# Patient Record
Sex: Female | Born: 1995 | Race: White | Hispanic: No | Marital: Single | Smoking: Former smoker
Health system: Southern US, Community
[De-identification: ages and names within clinical notes are randomized; demographics above are authoritative.]

## PROBLEM LIST (undated history)

## (undated) ENCOUNTER — Inpatient Hospital Stay (HOSPITAL_COMMUNITY): Payer: Self-pay

## (undated) DIAGNOSIS — N390 Urinary tract infection, site not specified: Secondary | ICD-10-CM

## (undated) DIAGNOSIS — Z3490 Encounter for supervision of normal pregnancy, unspecified, unspecified trimester: Secondary | ICD-10-CM

## (undated) DIAGNOSIS — D649 Anemia, unspecified: Secondary | ICD-10-CM

## (undated) DIAGNOSIS — N730 Acute parametritis and pelvic cellulitis: Secondary | ICD-10-CM

## (undated) DIAGNOSIS — F32A Depression, unspecified: Secondary | ICD-10-CM

## (undated) DIAGNOSIS — K219 Gastro-esophageal reflux disease without esophagitis: Secondary | ICD-10-CM

## (undated) DIAGNOSIS — F419 Anxiety disorder, unspecified: Secondary | ICD-10-CM

## (undated) DIAGNOSIS — T7840XA Allergy, unspecified, initial encounter: Secondary | ICD-10-CM

## (undated) DIAGNOSIS — F329 Major depressive disorder, single episode, unspecified: Secondary | ICD-10-CM

## (undated) DIAGNOSIS — R519 Headache, unspecified: Secondary | ICD-10-CM

## (undated) DIAGNOSIS — F429 Obsessive-compulsive disorder, unspecified: Secondary | ICD-10-CM

## (undated) HISTORY — PX: OTHER SURGICAL HISTORY: SHX169

## (undated) HISTORY — PX: LAPAROSCOPIC CHOLECYSTECTOMY: SUR755

---

## 2014-10-30 ENCOUNTER — Ambulatory Visit (HOSPITAL_COMMUNITY)
Admission: RE | Admit: 2014-10-30 | Discharge: 2014-10-30 | Disposition: A | Discharge status: Home / Self Care | Payer: Self-pay | Attending: Psychiatry | Admitting: Psychiatry

## 2014-10-30 DIAGNOSIS — F419 Anxiety disorder, unspecified: Secondary | ICD-10-CM | POA: Insufficient documentation
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
## 2015-07-06 LAB — OB RESULTS CONSOLE HEPATITIS B SURFACE ANTIGEN: HEP B S AG: NEGATIVE

## 2015-07-06 LAB — OB RESULTS CONSOLE GC/CHLAMYDIA
Chlamydia: NEGATIVE
Gonorrhea: NEGATIVE

## 2015-07-06 LAB — OB RESULTS CONSOLE RPR: RPR: NONREACTIVE

## 2015-07-06 LAB — OB RESULTS CONSOLE HIV ANTIBODY (ROUTINE TESTING): HIV: NONREACTIVE

## 2015-07-06 LAB — OB RESULTS CONSOLE RUBELLA ANTIBODY, IGM: RUBELLA: IMMUNE

## 2015-07-06 LAB — OB RESULTS CONSOLE ABO/RH: RH TYPE: POSITIVE

## 2015-07-06 LAB — OB RESULTS CONSOLE ANTIBODY SCREEN: Antibody Screen: NEGATIVE

## 2016-01-12 LAB — OB RESULTS CONSOLE GBS: STREP GROUP B AG: NEGATIVE

## 2016-01-23 ENCOUNTER — Encounter (HOSPITAL_COMMUNITY): Payer: Self-pay

## 2016-01-23 ENCOUNTER — Inpatient Hospital Stay (HOSPITAL_COMMUNITY)
Admission: AD | Admit: 2016-01-23 | Discharge: 2016-01-23 | Disposition: A | Discharge status: Home / Self Care | Payer: Medicaid Other | Source: Ambulatory Visit | Attending: Obstetrics and Gynecology | Admitting: Obstetrics and Gynecology

## 2016-01-23 DIAGNOSIS — O26893 Other specified pregnancy related conditions, third trimester: Secondary | ICD-10-CM | POA: Diagnosis not present

## 2016-01-23 DIAGNOSIS — Z3A38 38 weeks gestation of pregnancy: Secondary | ICD-10-CM | POA: Diagnosis not present

## 2016-01-23 DIAGNOSIS — O99343 Other mental disorders complicating pregnancy, third trimester: Secondary | ICD-10-CM | POA: Insufficient documentation

## 2016-01-23 DIAGNOSIS — R079 Chest pain, unspecified: Secondary | ICD-10-CM | POA: Insufficient documentation

## 2016-01-23 DIAGNOSIS — O368130 Decreased fetal movements, third trimester, not applicable or unspecified: Secondary | ICD-10-CM | POA: Insufficient documentation

## 2016-01-23 DIAGNOSIS — F419 Anxiety disorder, unspecified: Secondary | ICD-10-CM | POA: Diagnosis not present

## 2016-01-23 DIAGNOSIS — Z3689 Encounter for other specified antenatal screening: Secondary | ICD-10-CM

## 2016-01-23 HISTORY — DX: Anxiety disorder, unspecified: F41.9

## 2016-01-23 HISTORY — DX: Obsessive-compulsive disorder, unspecified: F42.9

## 2016-01-23 HISTORY — DX: Depression, unspecified: F32.A

## 2016-01-23 HISTORY — DX: Anemia, unspecified: D64.9

## 2016-01-23 HISTORY — DX: Major depressive disorder, single episode, unspecified: F32.9

## 2016-01-23 HISTORY — DX: Gastro-esophageal reflux disease without esophagitis: K21.9

## 2016-01-23 NOTE — MAU Provider Note (Signed)
History   [id number]   Chief Complaint  Patient presents with  . Decreased Fetal Movement    HPI Wendy Park is a 20 y.o. female  G1P0 at [redacted]w[redacted]d IUP here with report of decreased fetal movement with last movement felt an hour ago.  Since arrival has been feeling baby move.  Also reports cramping rated 7/10.  Denies leaking of fluid or vaginal bleeding.  Reported feeling pain in chest earlier today.  Denies pain at this time.  No report of shortness of breath.  History of anxiety, takes vistaril nightly for this.     No LMP recorded. Patient is pregnant.  OB History  Gravida Para Term Preterm AB Living  1            SAB TAB Ectopic Multiple Live Births               # Outcome Date GA Lbr Len/2nd Weight Sex Delivery Anes PTL Lv  1 Current               No past medical history on file.  No family history on file.  Social History   Social History  . Marital status: Single    Spouse name: N/A  . Number of children: N/A  . Years of education: N/A   Social History Main Topics  . Smoking status: Not on file  . Smokeless tobacco: Not on file  . Alcohol use Not on file  . Drug use: Unknown  . Sexual activity: Not on file   Other Topics Concern  . Not on file   Social History Narrative  . No narrative on file    Allergies not on file  No current facility-administered medications on file prior to encounter.    No current outpatient prescriptions on file prior to encounter.     Review of Systems  Respiratory: Negative for chest tightness, shortness of breath and wheezing.   Cardiovascular: Negative for chest pain and palpitations.  Genitourinary: Positive for pelvic pain (cramping). Negative for dyspareunia, vaginal bleeding and vaginal discharge.     Physical Exam   Vitals:   01/23/16 2240  BP: 127/72  Pulse: 100  Resp: 20  Temp: 98.5 F (36.9 C)  TempSrc: Oral  SpO2: 100%  Weight: 188 lb (85.3 kg)  Height: 5\' 2"  (1.575 m)    Physical Exam   Constitutional: She is oriented to person, place, and time. She appears well-developed and well-nourished.  HENT:  Head: Normocephalic.  Neck: Normal range of motion. Neck supple.  Cardiovascular: Normal rate, regular rhythm and normal heart sounds.   Respiratory: Effort normal and breath sounds normal. No respiratory distress.  GI: Soft. There is no tenderness.  Genitourinary: No bleeding in the vagina. Vaginal discharge (mucusy) found.  Musculoskeletal: Normal range of motion. She exhibits no edema.  Neurological: She is alert and oriented to person, place, and time.  Skin: Skin is warm and dry.   Dilation: 1 Effacement (%): 50 Cervical Position: Posterior Presentation: Vertex Exam by:: w. KarimCNM MAU Course  Procedures  MDM No results found for this or any previous visit (from the past 24 hour(s)).  2315 Dr. Richardson Dopp given report and current patient status > discharge home with follow-up on Tuesday as scheduled.    Assessment and Plan  20 y.o. G1P0 at [redacted]w[redacted]d IUP  Reactive NST  Plan: Discharge home Keep scheduled appt Reviewed fetal kick count  Wendy Park, CNM 01/23/2016 11:19 PM

## 2016-01-23 NOTE — MAU Note (Signed)
Urine in lab 

## 2016-01-23 NOTE — MAU Note (Signed)
Pt denies any pain now nor SOB or chest pain after hearing the baby's heart beat. Marland Kitchen

## 2016-01-23 NOTE — MAU Note (Signed)
Pt c/o decreased fetal movement today-last felt movement aobut an hour ago. Some lower abdominal cramping-rates 7/10; did not take medication but slept. Denies LOF or vag bleeding. Has c/o chest pain today-denies SOB or hx of cardiac issues. Has hx of anxiety-thinks it could be related to that.

## 2016-02-01 ENCOUNTER — Inpatient Hospital Stay (HOSPITAL_COMMUNITY)
Admission: AD | Admit: 2016-02-01 | Discharge: 2016-02-02 | Disposition: A | Discharge status: Home / Self Care | Payer: Medicaid Other | Source: Ambulatory Visit | Attending: Obstetrics and Gynecology | Admitting: Obstetrics and Gynecology

## 2016-02-01 DIAGNOSIS — F32A Depression, unspecified: Secondary | ICD-10-CM | POA: Diagnosis not present

## 2016-02-01 DIAGNOSIS — F17210 Nicotine dependence, cigarettes, uncomplicated: Secondary | ICD-10-CM | POA: Insufficient documentation

## 2016-02-01 DIAGNOSIS — O99333 Smoking (tobacco) complicating pregnancy, third trimester: Secondary | ICD-10-CM | POA: Insufficient documentation

## 2016-02-01 DIAGNOSIS — F429 Obsessive-compulsive disorder, unspecified: Secondary | ICD-10-CM | POA: Diagnosis not present

## 2016-02-01 DIAGNOSIS — O99343 Other mental disorders complicating pregnancy, third trimester: Secondary | ICD-10-CM

## 2016-02-01 DIAGNOSIS — F418 Other specified anxiety disorders: Secondary | ICD-10-CM | POA: Insufficient documentation

## 2016-02-01 DIAGNOSIS — Z3A39 39 weeks gestation of pregnancy: Secondary | ICD-10-CM

## 2016-02-01 DIAGNOSIS — O26893 Other specified pregnancy related conditions, third trimester: Secondary | ICD-10-CM | POA: Insufficient documentation

## 2016-02-01 DIAGNOSIS — F17200 Nicotine dependence, unspecified, uncomplicated: Secondary | ICD-10-CM | POA: Diagnosis present

## 2016-02-01 DIAGNOSIS — O99013 Anemia complicating pregnancy, third trimester: Secondary | ICD-10-CM

## 2016-02-01 DIAGNOSIS — O99613 Diseases of the digestive system complicating pregnancy, third trimester: Secondary | ICD-10-CM | POA: Insufficient documentation

## 2016-02-01 DIAGNOSIS — K219 Gastro-esophageal reflux disease without esophagitis: Secondary | ICD-10-CM

## 2016-02-01 DIAGNOSIS — D649 Anemia, unspecified: Secondary | ICD-10-CM | POA: Insufficient documentation

## 2016-02-01 DIAGNOSIS — Z8759 Personal history of other complications of pregnancy, childbirth and the puerperium: Secondary | ICD-10-CM

## 2016-02-01 DIAGNOSIS — F329 Major depressive disorder, single episode, unspecified: Secondary | ICD-10-CM | POA: Diagnosis not present

## 2016-02-01 DIAGNOSIS — F419 Anxiety disorder, unspecified: Secondary | ICD-10-CM | POA: Diagnosis not present

## 2016-02-01 NOTE — MAU Note (Signed)
Pt here for ?LOF since 2pm. Some lower abdominal cramping. Denies vag bleeding. +FM.

## 2016-02-02 ENCOUNTER — Encounter (HOSPITAL_COMMUNITY): Payer: Self-pay | Admitting: *Deleted

## 2016-02-02 ENCOUNTER — Inpatient Hospital Stay (HOSPITAL_COMMUNITY)
Admission: AD | Admit: 2016-02-02 | Discharge: 2016-02-06 | DRG: 766 | Disposition: A | Discharge status: Home / Self Care | Payer: Medicaid Other | Source: Ambulatory Visit | Attending: Obstetrics & Gynecology | Admitting: Obstetrics & Gynecology

## 2016-02-02 ENCOUNTER — Encounter (HOSPITAL_COMMUNITY): Payer: Self-pay

## 2016-02-02 DIAGNOSIS — F419 Anxiety disorder, unspecified: Secondary | ICD-10-CM | POA: Diagnosis not present

## 2016-02-02 DIAGNOSIS — O99344 Other mental disorders complicating childbirth: Secondary | ICD-10-CM | POA: Diagnosis present

## 2016-02-02 DIAGNOSIS — F429 Obsessive-compulsive disorder, unspecified: Secondary | ICD-10-CM | POA: Diagnosis not present

## 2016-02-02 DIAGNOSIS — O9962 Diseases of the digestive system complicating childbirth: Secondary | ICD-10-CM | POA: Diagnosis present

## 2016-02-02 DIAGNOSIS — O99214 Obesity complicating childbirth: Secondary | ICD-10-CM | POA: Diagnosis present

## 2016-02-02 DIAGNOSIS — Z3A39 39 weeks gestation of pregnancy: Secondary | ICD-10-CM

## 2016-02-02 DIAGNOSIS — O339 Maternal care for disproportion, unspecified: Secondary | ICD-10-CM | POA: Diagnosis present

## 2016-02-02 DIAGNOSIS — F329 Major depressive disorder, single episode, unspecified: Secondary | ICD-10-CM | POA: Diagnosis not present

## 2016-02-02 DIAGNOSIS — D649 Anemia, unspecified: Secondary | ICD-10-CM | POA: Diagnosis present

## 2016-02-02 DIAGNOSIS — Z6834 Body mass index (BMI) 34.0-34.9, adult: Secondary | ICD-10-CM

## 2016-02-02 DIAGNOSIS — K219 Gastro-esophageal reflux disease without esophagitis: Secondary | ICD-10-CM | POA: Diagnosis present

## 2016-02-02 DIAGNOSIS — O4202 Full-term premature rupture of membranes, onset of labor within 24 hours of rupture: Principal | ICD-10-CM | POA: Diagnosis present

## 2016-02-02 DIAGNOSIS — Z8759 Personal history of other complications of pregnancy, childbirth and the puerperium: Secondary | ICD-10-CM

## 2016-02-02 DIAGNOSIS — O9902 Anemia complicating childbirth: Secondary | ICD-10-CM | POA: Diagnosis present

## 2016-02-02 DIAGNOSIS — F17210 Nicotine dependence, cigarettes, uncomplicated: Secondary | ICD-10-CM | POA: Diagnosis present

## 2016-02-02 DIAGNOSIS — F17200 Nicotine dependence, unspecified, uncomplicated: Secondary | ICD-10-CM | POA: Diagnosis present

## 2016-02-02 DIAGNOSIS — F32A Depression, unspecified: Secondary | ICD-10-CM | POA: Diagnosis not present

## 2016-02-02 DIAGNOSIS — O99334 Smoking (tobacco) complicating childbirth: Secondary | ICD-10-CM | POA: Diagnosis present

## 2016-02-02 DIAGNOSIS — O4290 Premature rupture of membranes, unspecified as to length of time between rupture and onset of labor, unspecified weeks of gestation: Secondary | ICD-10-CM | POA: Diagnosis present

## 2016-02-02 DIAGNOSIS — O326XX0 Maternal care for compound presentation, not applicable or unspecified: Secondary | ICD-10-CM | POA: Diagnosis present

## 2016-02-02 DIAGNOSIS — F418 Other specified anxiety disorders: Secondary | ICD-10-CM | POA: Diagnosis present

## 2016-02-02 HISTORY — DX: Allergy, unspecified, initial encounter: T78.40XA

## 2016-02-02 LAB — TYPE AND SCREEN
ABO/RH(D): O POS
ANTIBODY SCREEN: NEGATIVE

## 2016-02-02 LAB — CBC
HEMATOCRIT: 32.5 % — AB (ref 36.0–46.0)
HEMOGLOBIN: 11.4 g/dL — AB (ref 12.0–15.0)
MCH: 29 pg (ref 26.0–34.0)
MCHC: 35.1 g/dL (ref 30.0–36.0)
MCV: 82.7 fL (ref 78.0–100.0)
Platelets: 196 10*3/uL (ref 150–400)
RBC: 3.93 MIL/uL (ref 3.87–5.11)
RDW: 13.5 % (ref 11.5–15.5)
WBC: 9.1 10*3/uL (ref 4.0–10.5)

## 2016-02-02 LAB — POCT FERN TEST: POCT Fern Test: POSITIVE

## 2016-02-02 LAB — ABO/RH: ABO/RH(D): O POS

## 2016-02-02 LAB — AMNISURE RUPTURE OF MEMBRANE (ROM) NOT AT ARMC: Amnisure ROM: NEGATIVE

## 2016-02-02 MED ORDER — OXYCODONE-ACETAMINOPHEN 5-325 MG PO TABS: 1.0000 | ORAL_TABLET | ORAL | Status: DC | PRN

## 2016-02-02 MED ORDER — OXYTOCIN BOLUS FROM INFUSION
500.0000 mL | Freq: Once | INTRAVENOUS | Status: DC
Start: 2016-02-02 — End: 2016-02-03

## 2016-02-02 MED ORDER — OXYTOCIN 40 UNITS IN LACTATED RINGERS INFUSION - SIMPLE MED: 2.5000 [IU]/h | INTRAVENOUS | Status: DC

## 2016-02-02 MED ORDER — TERBUTALINE SULFATE 1 MG/ML IJ SOLN: 0.2500 mg | Freq: Once | INTRAMUSCULAR | Status: DC | PRN

## 2016-02-02 MED ORDER — LACTATED RINGERS IV SOLN
500.0000 mL | INTRAVENOUS | Status: DC | PRN
  Administered 2016-02-03: 1000 mL via INTRAVENOUS

## 2016-02-02 MED ORDER — ONDANSETRON HCL 4 MG/2ML IJ SOLN: 4.0000 mg | Freq: Four times a day (QID) | INTRAMUSCULAR | Status: DC | PRN

## 2016-02-02 MED ORDER — ZOLPIDEM TARTRATE 5 MG PO TABS: 5.0000 mg | ORAL_TABLET | Freq: Every evening | ORAL | Status: DC | PRN

## 2016-02-02 MED ORDER — LACTATED RINGERS IV SOLN
INTRAVENOUS | Status: DC
  Administered 2016-02-02 – 2016-02-03 (×3): via INTRAVENOUS

## 2016-02-02 MED ORDER — OXYCODONE-ACETAMINOPHEN 5-325 MG PO TABS: 2.0000 | ORAL_TABLET | ORAL | Status: DC | PRN

## 2016-02-02 MED ORDER — SOD CITRATE-CITRIC ACID 500-334 MG/5ML PO SOLN
30.0000 mL | ORAL | Status: DC | PRN
  Administered 2016-02-03: 30 mL via ORAL

## 2016-02-02 MED ORDER — ACETAMINOPHEN 325 MG PO TABS: 650.0000 mg | ORAL_TABLET | ORAL | Status: DC | PRN

## 2016-02-02 MED ORDER — LIDOCAINE HCL (PF) 1 % IJ SOLN
30.0000 mL | INTRAMUSCULAR | Status: AC | PRN
  Administered 2016-02-03: 5 mL via SUBCUTANEOUS

## 2016-02-02 MED ORDER — OXYTOCIN 40 UNITS IN LACTATED RINGERS INFUSION - SIMPLE MED
1.0000 m[IU]/min | INTRAVENOUS | Status: DC
  Administered 2016-02-02: 1 m[IU]/min via INTRAVENOUS
  Filled 2016-02-02: qty 1000

## 2016-02-02 MED ORDER — MISOPROSTOL 25 MCG QUARTER TABLET: 25.0000 ug | ORAL_TABLET | ORAL | Status: DC | PRN

## 2016-02-02 NOTE — Discharge Instructions (Signed)
Braxton Hicks Contractions °Contractions of the uterus can occur throughout pregnancy. Contractions are not always a sign that you are in labor.  °WHAT ARE BRAXTON HICKS CONTRACTIONS?  °Contractions that occur before labor are called Braxton Hicks contractions, or false labor. Toward the end of pregnancy (32-34 weeks), these contractions can develop more often and may become more forceful. This is not true labor because these contractions do not result in opening (dilatation) and thinning of the cervix. They are sometimes difficult to tell apart from true labor because these contractions can be forceful and people have different pain tolerances. You should not feel embarrassed if you go to the hospital with false labor. Sometimes, the only way to tell if you are in true labor is for your health care provider to look for changes in the cervix. °If there are no prenatal problems or other health problems associated with the pregnancy, it is completely safe to be sent home with false labor and await the onset of true labor. °HOW CAN YOU TELL THE DIFFERENCE BETWEEN TRUE AND FALSE LABOR? °False Labor °· The contractions of false labor are usually shorter and not as hard as those of true labor.   °· The contractions are usually irregular.   °· The contractions are often felt in the front of the lower abdomen and in the groin.   °· The contractions may go away when you walk around or change positions while lying down.   °· The contractions get weaker and are shorter lasting as time goes on.   °· The contractions do not usually become progressively stronger, regular, and closer together as with true labor.   °True Labor °· Contractions in true labor last 30-70 seconds, become very regular, usually become more intense, and increase in frequency.   °· The contractions do not go away with walking.   °· The discomfort is usually felt in the top of the uterus and spreads to the lower abdomen and low back.   °· True labor can be  determined by your health care provider with an exam. This will show that the cervix is dilating and getting thinner.   °WHAT TO REMEMBER °· Keep up with your usual exercises and follow other instructions given by your health care provider.   °· Take medicines as directed by your health care provider.   °· Keep your regular prenatal appointments.   °· Eat and drink lightly if you think you are going into labor.   °· If Braxton Hicks contractions are making you uncomfortable:   °¨ Change your position from lying down or resting to walking, or from walking to resting.   °¨ Sit and rest in a tub of warm water.   °¨ Drink 2-3 glasses of water. Dehydration may cause these contractions.   °¨ Do slow and deep breathing several times an hour.   °WHEN SHOULD I SEEK IMMEDIATE MEDICAL CARE? °Seek immediate medical care if: °· Your contractions become stronger, more regular, and closer together.   °· You have fluid leaking or gushing from your vagina.   °· You have a fever.   °· You pass blood-tinged mucus.   °· You have vaginal bleeding.   °· You have continuous abdominal pain.   °· You have low back pain that you never had before.   °· You feel your baby's head pushing down and causing pelvic pressure.   °· Your baby is not moving as much as it used to.   °  °This information is not intended to replace advice given to you by your health care provider. Make sure you discuss any questions you have with your health care   provider. °  °Document Released: 06/19/2005 Document Revised: 06/24/2013 Document Reviewed: 03/31/2013 °Elsevier Interactive Patient Education ©2016 Else[employer] Inc. ° °

## 2016-02-02 NOTE — MAU Note (Signed)
States water broke at 1655. Some cramping.

## 2016-02-02 NOTE — Anesthesia Pain Management Evaluation Note (Signed)
  CRNA Pain Management Visit Note  Patient: Wendy Park, 20 y.o., female  "Hello I am a member of the anesthesia team at [facility]. We have an anesthesia team available at all times to provide care throughout the hospital, including epidural management and anesthesia for C-section. I don't know your plan for the delivery whether it a natural birth, water birth, IV sedation, nitrous supplementation, doula or epidural, but we want to meet your pain goals."   1.Was your pain managed to your expectations on prior hospitalizations?   No prior hospitalizations  2.What is your expectation for pain management during this hospitalization?     Epidural  3.How can we help you reach that goal? epidural  Record the patient's initial score and the patient's pain goal.   Pain: 2  Pain Goal: 5 [facility] wants you to be able to say your pain was always managed very well.  Luman Holway 02/02/2016

## 2016-02-02 NOTE — MAU Provider Note (Signed)
History   20 yo G1P0 at 68 5/7 weeks presented after calling to report two episodes of ? Leaking today--previous episode during the day, called office, advised to continue to observe.  2nd episode tonight, with "panties wet", but dry pad on arrival.  Reports mild cramping, good FM.  Membranes swept in office yesterday, with cervix 1 cm, thick by patient report.    [place] for previous polyhydramnios noted at 37 weeks, now resolved to AFI 20, 80%ile on last 2 exams, BPP 8/8.  Pregnancy remarkable for use of Zoloft and Vistaril due to anxiety, normal anatomy US with posterior placenta, mild polyhydramnios noted at 35 weeks, AFI 20 on most recent US, GBS negative.  Patient Active Problem List   Diagnosis Date Noted  . Anemia 02/02/2016  . Anxiety 02/02/2016  . Depression 02/02/2016  . OCD (obsessive compulsive disorder) 02/02/2016  . Smoker 02/02/2016  . H/O polyhydramnios--resolved at 38 weeks 02/02/2016    Chief Complaint  Patient presents with  . Rupture of Membranes   HPI:  As above  OB History    Gravida Para Term Preterm AB Living   1             SAB TAB Ectopic Multiple Live Births                  Past Medical History:  Diagnosis Date  . Anemia   . Anxiety   . Depression   . GERD (gastroesophageal reflux disease)   . OCD (obsessive compulsive disorder)   Sees Deatra Robinson, PA, for mental health issues.  Past Surgical History:  Procedure Laterality Date  . miringotomy      Family Hx:  Sister with asthma, thyroid dx, and chronic Hep C; Mother anxiety  Social History  Substance Use Topics  . Smoking status: Current Every Day Smoker    Packs/day: 0.25    Years: 5.00    Types: Cigarettes  . Smokeless tobacco: Current User  . Alcohol use No  Patient is Caucasian, single, with FOB, Dedra Skeens, who is involved and supportive.  She has 2 years of college and is currently unemployed.  She lives in [place].  Allergies: No Known Allergies  Prescriptions Prior to  Admission  Medication Sig Dispense Refill Last Dose  . hydrOXYzine (ATARAX/VISTARIL) 50 MG tablet Take 50 mg by mouth 3 (three) times daily as needed.   01/22/2016 at Unknown time  . prenatal vitamin w/FE, FA (PRENATAL 1 + 1) 27-1 MG TABS tablet Take 1 tablet by mouth daily at 12 noon.   01/22/2016 at Unknown time  . sertraline (ZOLOFT) 100 MG tablet Take 100 mg by mouth daily.   01/22/2016 at Unknown time     Prenatal Transfer Tool  Maternal Diabetes: No Genetic Screening: Normal 1st trimester screen and AFP Maternal Ultrasounds/Referrals: Abnormal:  Findings:   Other:Polyhydramnios at 35 weeks, resolved on f/u US Fetal Ultrasounds or other Referrals:  None Maternal Substance Abuse:  Yes:  Type: Smoker Significant Maternal Medications:  Meds include: Zoloft Significant Maternal Lab Results: Lab values include: Group B Strep negative  Prenatal Labs: O+ Antibody neg RPR NR HIV NR HBsAG negative Rubella immune Glucola WNL Genetic screening WNL Hgb 12.6 at NOB, 11 at 28 weeks    ROS:  Occasional cramping, vaginal d/c, +FM Physical Exam   Blood pressure 123/79, pulse 106, temperature 97.9 F (36.6 C), temperature source Oral, resp. rate 20, height 5\' 2"  (1.575 m), weight 86.2 kg (190 lb), SpO2 99 %.  Physical Exam In NAD Chest clear Heart RRR without murmur Abd gravid, NT Pelvic--no obvious leaking, cervix loose 1 cm, 50%, vtx, -2, small amount thin d/c. Ext WNL  FHR Category 1 UCs mild irritability  ED Course  Assessment: IUP at 39 5/7 weeks ? Leaking of fluid GBS negative Anxiety/OCD  Plan: Amnisure NST   Nigel Bridgeman CNM, MSN 02/02/2016 12:14 AM   Addendum: Results for orders placed or performed during the hospital encounter of 02/01/16 (from the past 24 hour(s))  Amnisure rupture of membrane (rom)not at [facility]     Status: None   Collection Time: 02/02/16 12:03 AM  Result Value Ref Range   Amnisure ROM NEGATIVE    FHR Category 1 UCs mild  irritability  D/C home with labor precautions. Needs ROB appt next week--awaiting call from office to schedule. Reassured as to normalcy of status.  Nigel Bridgeman, CNM 02/02/16 [phone]

## 2016-02-02 NOTE — H&P (Signed)
History   20 yo G1P0 at 69 5/7 weeks presented with SROM at 4:55p today, clear fluid, mild cramping.  Cervix was 1-2, 70%, vtx, -2 in MAU.  [place] for previous polyhydramnios noted at 37 weeks, now resolved to AFI 20, 80%ile on last 2 exams, BPP 8/8.  Pregnancy remarkable for use of Zoloft and Vistaril due to anxiety, normal anatomy US with posterior placenta, mild polyhydramnios noted at 35 weeks, AFI 20 on most recent US, GBS negative.      Patient Active Problem List   Diagnosis Date Noted  . Anemia 02/02/2016  . Anxiety 02/02/2016  . Depression 02/02/2016  . OCD (obsessive compulsive disorder) 02/02/2016  . Smoker 02/02/2016  . H/O polyhydramnios--resolved at 38 weeks 02/02/2016       Chief Complaint  Patient presents with  . Rupture of Membranes   HPI:  As above          OB History    Gravida Para Term Preterm AB Living   1             SAB TAB Ectopic Multiple Live Births                      Past Medical History:  Diagnosis Date  . Anemia   . Anxiety   . Depression   . GERD (gastroesophageal reflux disease)   . OCD (obsessive compulsive disorder)   Sees Deatra Robinson, PA, for mental health issues.       Past Surgical History:  Procedure Laterality Date  . miringotomy      Family Hx:  Sister with asthma, thyroid dx, and chronic Hep C; Mother anxiety       Social History  Substance Use Topics  . Smoking status: Current Every Day Smoker    Packs/day: 0.25    Years: 5.00    Types: Cigarettes  . Smokeless tobacco: Current User  . Alcohol use No  Patient is Caucasian, single, with FOB, Dedra Skeens, who is involved and supportive.  She has 2 years of college and is currently unemployed.  She lives in [place].  Allergies: No Known Allergies         Prescriptions Prior to Admission  Medication Sig Dispense Refill Last Dose  . hydrOXYzine (ATARAX/VISTARIL) 50 MG tablet Take 50 mg by mouth 3 (three) times daily as needed.    01/22/2016 at Unknown time  . prenatal vitamin w/FE, FA (PRENATAL 1 + 1) 27-1 MG TABS tablet Take 1 tablet by mouth daily at 12 noon.   01/22/2016 at Unknown time  . sertraline (ZOLOFT) 100 MG tablet Take 100 mg by mouth daily.   01/22/2016 at Unknown time     Prenatal Transfer Tool  Maternal Diabetes: No Genetic Screening: Normal 1st trimester screen and AFP Maternal Ultrasounds/Referrals: Abnormal:  Findings:   Other:Polyhydramnios at 35 weeks, resolved on f/u US Fetal Ultrasounds or other Referrals:  None Maternal Substance Abuse:  Yes:  Type: Smoker Significant Maternal Medications:  Meds include: Zoloft Significant Maternal Lab Results: Lab values include: Group B Strep negative  Prenatal Labs: O+ Antibody neg RPR NR HIV NR HBsAG negative Rubella immune Glucola WNL Genetic screening WNL Hgb 12.6 at NOB, 11 at 28 weeks    ROS:  Occasional cramping, leaking clear fluid, +FM Physical Exam   Vitals:   02/02/16 1718 02/02/16 1830 02/02/16 1903  BP: 126/74 106/61   Pulse: (!) 126 (!) 119   Resp: 18 18 20   Temp: 98.4 F (  36.9 C) 98.2 F (36.8 C)   TempSrc: Oral Oral   Weight: 85.3 kg (188 lb)    Height:  (1.575 m)      Physical Exam In NAD Chest clear Heart RRR without murmur Abd gravid, NT Pelvic--Leaking clear fluid, VE loose 1 cm, 70%, vtx, -2 by RN exam at 5:30p Ext WNL  FHR Category 1 UCs mild irritability  ED Course  Assessment: IUP at 39 5/7 weeks SROM at 5p--clear fluid No labor GBS negative Anxiety/OCD--on Zoloft and Vistaril, in therapy  Plan: Admitted to [facility] per consult with Dr. Normand Sloop. Routine [facility] orders Pain med/epidural prn Discussed use of pitocin with patient and family, including R&B.  They are agreeable with proceeding with pitocin augmentation. Will allow light meal before beginning pitocin.   Nigel Bridgeman CNM, MSN   02/02/16 7:20p

## 2016-02-02 NOTE — Progress Notes (Signed)
  Subjective: Finished light meal  Objective: BP 108/64   Pulse (!) 101   Temp 97.8 F (36.6 C) (Oral)   Resp 18   Ht 5\' 2"  (1.575 m)   Wt 85.3 kg (188 lb)   BMI 34.39 kg/m  No intake/output data recorded. No intake/output data recorded.  FHT: Category 1 UC:   Irregular, mild SVE:   Dilation: 1.5 Effacement (%): 70 Station: -2 Exam by:: V Daylee Delahoz  Leaking clear fluid  Assessment:  PROM at term, no labor GBS positive  Plan: Start pitocin augmentation--patient agreeable with plan.  Nigel Bridgeman CNM 02/02/2016, 10:12 PM

## 2016-02-03 ENCOUNTER — Encounter (HOSPITAL_COMMUNITY): Payer: Self-pay | Admitting: *Deleted

## 2016-02-03 ENCOUNTER — Inpatient Hospital Stay (HOSPITAL_COMMUNITY): Payer: Medicaid Other | Admitting: Anesthesiology

## 2016-02-03 ENCOUNTER — Encounter (HOSPITAL_COMMUNITY)
Admission: AD | Disposition: A | Discharge status: Home / Self Care | Payer: Self-pay | Source: Ambulatory Visit | Attending: Obstetrics & Gynecology

## 2016-02-03 LAB — RPR: RPR: NONREACTIVE

## 2016-02-03 SURGERY — Surgical Case
Anesthesia: Epidural

## 2016-02-03 MED ORDER — SIMETHICONE 80 MG PO CHEW
80.0000 mg | CHEWABLE_TABLET | ORAL | Status: DC
  Administered 2016-02-04 – 2016-02-06 (×3): 80 mg via ORAL
  Filled 2016-02-03 (×3): qty 1

## 2016-02-03 MED ORDER — WITCH HAZEL-GLYCERIN EX PADS: 1.0000 "application " | MEDICATED_PAD | CUTANEOUS | Status: DC | PRN

## 2016-02-03 MED ORDER — OXYTOCIN 10 UNIT/ML IJ SOLN
INTRAMUSCULAR | Status: AC
  Filled 2016-02-03: qty 4

## 2016-02-03 MED ORDER — SCOPOLAMINE 1 MG/3DAYS TD PT72: 1.0000 | MEDICATED_PATCH | Freq: Once | TRANSDERMAL | Status: DC

## 2016-02-03 MED ORDER — IBUPROFEN 600 MG PO TABS
600.0000 mg | ORAL_TABLET | Freq: Four times a day (QID) | ORAL | Status: DC
  Administered 2016-02-04 – 2016-02-06 (×11): 600 mg via ORAL
  Filled 2016-02-03 (×11): qty 1

## 2016-02-03 MED ORDER — KETOROLAC TROMETHAMINE 30 MG/ML IJ SOLN: 30.0000 mg | Freq: Once | INTRAMUSCULAR | Status: DC

## 2016-02-03 MED ORDER — SODIUM CHLORIDE 0.9% FLUSH: 3.0000 mL | INTRAVENOUS | Status: DC | PRN

## 2016-02-03 MED ORDER — COCONUT OIL OIL
1.0000 "application " | TOPICAL_OIL | Status: DC | PRN
  Administered 2016-02-05: 1 via TOPICAL
  Filled 2016-02-03: qty 120

## 2016-02-03 MED ORDER — DIPHENHYDRAMINE HCL 25 MG PO CAPS: 25.0000 mg | ORAL_CAPSULE | ORAL | Status: DC | PRN

## 2016-02-03 MED ORDER — SCOPOLAMINE 1 MG/3DAYS TD PT72
MEDICATED_PATCH | TRANSDERMAL | Status: AC
  Filled 2016-02-03: qty 1

## 2016-02-03 MED ORDER — ONDANSETRON HCL 4 MG/2ML IJ SOLN: 4.0000 mg | Freq: Three times a day (TID) | INTRAMUSCULAR | Status: DC | PRN

## 2016-02-03 MED ORDER — DIPHENHYDRAMINE HCL 50 MG/ML IJ SOLN
12.5000 mg | INTRAMUSCULAR | Status: DC | PRN
  Administered 2016-02-03 (×2): 12.5 mg via INTRAVENOUS
  Filled 2016-02-03: qty 1

## 2016-02-03 MED ORDER — EPHEDRINE 5 MG/ML INJ: 10.0000 mg | INTRAVENOUS | Status: DC | PRN

## 2016-02-03 MED ORDER — SIMETHICONE 80 MG PO CHEW
80.0000 mg | CHEWABLE_TABLET | Freq: Three times a day (TID) | ORAL | Status: DC
  Administered 2016-02-04 – 2016-02-06 (×8): 80 mg via ORAL
  Filled 2016-02-03 (×9): qty 1

## 2016-02-03 MED ORDER — MEPERIDINE HCL 25 MG/ML IJ SOLN: 6.2500 mg | INTRAMUSCULAR | Status: DC | PRN

## 2016-02-03 MED ORDER — KETOROLAC TROMETHAMINE 30 MG/ML IJ SOLN: 30.0000 mg | Freq: Four times a day (QID) | INTRAMUSCULAR | Status: AC | PRN

## 2016-02-03 MED ORDER — PHENYLEPHRINE 40 MCG/ML (10ML) SYRINGE FOR IV PUSH (FOR BLOOD PRESSURE SUPPORT): 80.0000 ug | PREFILLED_SYRINGE | INTRAVENOUS | Status: DC | PRN

## 2016-02-03 MED ORDER — KETOROLAC TROMETHAMINE 30 MG/ML IJ SOLN
30.0000 mg | Freq: Four times a day (QID) | INTRAMUSCULAR | Status: DC | PRN
  Administered 2016-02-03: 30 mg via INTRAMUSCULAR

## 2016-02-03 MED ORDER — ONDANSETRON HCL 4 MG/2ML IJ SOLN
INTRAMUSCULAR | Status: DC | PRN
  Administered 2016-02-03: 4 mg via INTRAVENOUS

## 2016-02-03 MED ORDER — DEXAMETHASONE SODIUM PHOSPHATE 4 MG/ML IJ SOLN
INTRAMUSCULAR | Status: DC | PRN
  Administered 2016-02-03: 4 mg via INTRAVENOUS

## 2016-02-03 MED ORDER — SCOPOLAMINE 1 MG/3DAYS TD PT72
MEDICATED_PATCH | TRANSDERMAL | Status: DC | PRN
  Administered 2016-02-03: 1 via TRANSDERMAL

## 2016-02-03 MED ORDER — LACTATED RINGERS IV SOLN
INTRAVENOUS | Status: DC
  Administered 2016-02-04: 06:00:00 via INTRAVENOUS

## 2016-02-03 MED ORDER — LIDOCAINE-EPINEPHRINE (PF) 2 %-1:200000 IJ SOLN
INTRAMUSCULAR | Status: AC
  Filled 2016-02-03: qty 20

## 2016-02-03 MED ORDER — FENTANYL 2.5 MCG/ML BUPIVACAINE 1/10 % EPIDURAL INFUSION (WH - ANES)
INTRAMUSCULAR | Status: AC
  Administered 2016-02-03: 14 mL/h via EPIDURAL
  Filled 2016-02-03: qty 125

## 2016-02-03 MED ORDER — OXYTOCIN 10 UNIT/ML IJ SOLN
INTRAVENOUS | Status: DC | PRN
  Administered 2016-02-03: 40 [IU] via INTRAVENOUS

## 2016-02-03 MED ORDER — PROMETHAZINE HCL 25 MG/ML IJ SOLN: 6.2500 mg | INTRAMUSCULAR | Status: DC | PRN

## 2016-02-03 MED ORDER — MENTHOL 3 MG MT LOZG: 1.0000 | LOZENGE | OROMUCOSAL | Status: DC | PRN

## 2016-02-03 MED ORDER — CEFAZOLIN SODIUM-DEXTROSE 2-4 GM/100ML-% IV SOLN
INTRAVENOUS | Status: AC
  Filled 2016-02-03: qty 100

## 2016-02-03 MED ORDER — LACTATED RINGERS IV SOLN
INTRAVENOUS | Status: DC | PRN
  Administered 2016-02-03: 20:00:00 via INTRAVENOUS

## 2016-02-03 MED ORDER — FENTANYL CITRATE (PF) 100 MCG/2ML IJ SOLN
INTRAMUSCULAR | Status: DC | PRN
  Administered 2016-02-03: 100 ug via EPIDURAL

## 2016-02-03 MED ORDER — SODIUM BICARBONATE 8.4 % IV SOLN
INTRAVENOUS | Status: AC
  Filled 2016-02-03: qty 50

## 2016-02-03 MED ORDER — DIPHENHYDRAMINE HCL 25 MG PO CAPS: 25.0000 mg | ORAL_CAPSULE | Freq: Four times a day (QID) | ORAL | Status: DC | PRN

## 2016-02-03 MED ORDER — ONDANSETRON HCL 4 MG/2ML IJ SOLN
INTRAMUSCULAR | Status: AC
  Filled 2016-02-03: qty 2

## 2016-02-03 MED ORDER — ACETAMINOPHEN 500 MG PO TABS
1000.0000 mg | ORAL_TABLET | Freq: Four times a day (QID) | ORAL | Status: AC
  Administered 2016-02-04 (×3): 1000 mg via ORAL
  Filled 2016-02-03 (×4): qty 2

## 2016-02-03 MED ORDER — SIMETHICONE 80 MG PO CHEW: 80.0000 mg | CHEWABLE_TABLET | ORAL | Status: DC | PRN

## 2016-02-03 MED ORDER — SODIUM CHLORIDE 0.9 % IR SOLN
Status: DC | PRN
  Administered 2016-02-03 (×2): 1

## 2016-02-03 MED ORDER — LIDOCAINE-EPINEPHRINE (PF) 2 %-1:200000 IJ SOLN
INTRAMUSCULAR | Status: DC | PRN
  Administered 2016-02-03 (×2): 10 mL via EPIDURAL
  Administered 2016-02-03: 4 mL via EPIDURAL

## 2016-02-03 MED ORDER — CEFAZOLIN SODIUM-DEXTROSE 2-3 GM-% IV SOLR
INTRAVENOUS | Status: DC | PRN
  Administered 2016-02-03: 2 g via INTRAVENOUS

## 2016-02-03 MED ORDER — DIBUCAINE 1 % RE OINT: 1.0000 "application " | TOPICAL_OINTMENT | RECTAL | Status: DC | PRN

## 2016-02-03 MED ORDER — ZOLPIDEM TARTRATE 5 MG PO TABS: 5.0000 mg | ORAL_TABLET | Freq: Every evening | ORAL | Status: DC | PRN

## 2016-02-03 MED ORDER — FENTANYL CITRATE (PF) 100 MCG/2ML IJ SOLN
100.0000 ug | INTRAMUSCULAR | Status: DC | PRN
  Administered 2016-02-03 (×2): 100 ug via INTRAVENOUS
  Filled 2016-02-03 (×2): qty 2

## 2016-02-03 MED ORDER — ACETAMINOPHEN 325 MG PO TABS
650.0000 mg | ORAL_TABLET | ORAL | Status: DC | PRN
  Administered 2016-02-05 – 2016-02-06 (×4): 650 mg via ORAL
  Filled 2016-02-03 (×4): qty 2

## 2016-02-03 MED ORDER — LACTATED RINGERS IV SOLN
INTRAVENOUS | Status: DC | PRN
  Administered 2016-02-03 (×3): via INTRAVENOUS

## 2016-02-03 MED ORDER — HYDROXYZINE HCL 50 MG PO TABS
50.0000 mg | ORAL_TABLET | Freq: Three times a day (TID) | ORAL | Status: DC
  Administered 2016-02-03 – 2016-02-06 (×10): 50 mg via ORAL
  Filled 2016-02-03 (×14): qty 1

## 2016-02-03 MED ORDER — LACTATED RINGERS IV SOLN
500.0000 mL | Freq: Once | INTRAVENOUS | Status: AC
  Administered 2016-02-03: 500 mL via INTRAVENOUS

## 2016-02-03 MED ORDER — MORPHINE SULFATE (PF) 0.5 MG/ML IJ SOLN
INTRAMUSCULAR | Status: DC | PRN
  Administered 2016-02-03: 3 mg via EPIDURAL

## 2016-02-03 MED ORDER — NALOXONE HCL 2 MG/2ML IJ SOSY: 1.0000 ug/kg/h | PREFILLED_SYRINGE | INTRAVENOUS | Status: DC | PRN

## 2016-02-03 MED ORDER — FENTANYL CITRATE (PF) 100 MCG/2ML IJ SOLN
INTRAMUSCULAR | Status: AC
  Filled 2016-02-03: qty 2

## 2016-02-03 MED ORDER — TETANUS-DIPHTH-ACELL PERTUSSIS 5-2.5-18.5 LF-MCG/0.5 IM SUSP: 0.5000 mL | Freq: Once | INTRAMUSCULAR | Status: DC

## 2016-02-03 MED ORDER — IBUPROFEN 600 MG PO TABS: 600.0000 mg | ORAL_TABLET | Freq: Four times a day (QID) | ORAL | Status: DC | PRN

## 2016-02-03 MED ORDER — KETOROLAC TROMETHAMINE 30 MG/ML IJ SOLN
INTRAMUSCULAR | Status: AC
  Filled 2016-02-03: qty 1

## 2016-02-03 MED ORDER — SENNOSIDES-DOCUSATE SODIUM 8.6-50 MG PO TABS
2.0000 | ORAL_TABLET | ORAL | Status: DC
  Administered 2016-02-04 – 2016-02-06 (×3): 2 via ORAL
  Filled 2016-02-03 (×3): qty 2

## 2016-02-03 MED ORDER — OXYTOCIN 40 UNITS IN LACTATED RINGERS INFUSION - SIMPLE MED: 2.5000 [IU]/h | INTRAVENOUS | Status: AC

## 2016-02-03 MED ORDER — NALOXONE HCL 0.4 MG/ML IJ SOLN: 0.4000 mg | INTRAMUSCULAR | Status: DC | PRN

## 2016-02-03 MED ORDER — PHENYLEPHRINE 40 MCG/ML (10ML) SYRINGE FOR IV PUSH (FOR BLOOD PRESSURE SUPPORT)
PREFILLED_SYRINGE | INTRAVENOUS | Status: AC
  Filled 2016-02-03: qty 20

## 2016-02-03 MED ORDER — PRENATAL MULTIVITAMIN CH
1.0000 | ORAL_TABLET | Freq: Every day | ORAL | Status: DC
  Administered 2016-02-04 – 2016-02-06 (×3): 1 via ORAL
  Filled 2016-02-03 (×3): qty 1

## 2016-02-03 MED ORDER — FENTANYL 2.5 MCG/ML BUPIVACAINE 1/10 % EPIDURAL INFUSION (WH - ANES)
14.0000 mL/h | INTRAMUSCULAR | Status: DC | PRN
  Administered 2016-02-03 (×3): 14 mL/h via EPIDURAL
  Filled 2016-02-03 (×2): qty 125

## 2016-02-03 MED ORDER — MORPHINE SULFATE (PF) 0.5 MG/ML IJ SOLN
INTRAMUSCULAR | Status: AC
  Filled 2016-02-03: qty 10

## 2016-02-03 MED ORDER — SERTRALINE HCL 100 MG PO TABS
100.0000 mg | ORAL_TABLET | Freq: Every day | ORAL | Status: DC
  Administered 2016-02-03 – 2016-02-06 (×4): 100 mg via ORAL
  Filled 2016-02-03 (×6): qty 1

## 2016-02-03 MED ORDER — DIPHENHYDRAMINE HCL 50 MG/ML IJ SOLN
12.5000 mg | INTRAMUSCULAR | Status: DC | PRN
  Administered 2016-02-03 – 2016-02-04 (×2): 12.5 mg via INTRAVENOUS
  Filled 2016-02-03 (×2): qty 1

## 2016-02-03 MED ORDER — NICOTINE 14 MG/24HR TD PT24
14.0000 mg | MEDICATED_PATCH | TRANSDERMAL | Status: DC
  Administered 2016-02-03 – 2016-02-04 (×2): 14 mg via TRANSDERMAL
  Filled 2016-02-03 (×5): qty 1

## 2016-02-03 SURGICAL SUPPLY — 36 items
CHLORAPREP W/TINT 26ML (MISCELLANEOUS) ×2 IMPLANT
CLAMP CORD UMBIL (MISCELLANEOUS) IMPLANT
CLOTH BEACON ORANGE TIMEOUT ST (SAFETY) ×2 IMPLANT
DRAPE C SECTION CLR SCREEN (DRAPES) ×2 IMPLANT
DRSG OPSITE POSTOP 4X10 (GAUZE/BANDAGES/DRESSINGS) ×2 IMPLANT
ELECT REM PT RETURN 9FT ADLT (ELECTROSURGICAL) ×2
ELECTRODE REM PT RTRN 9FT ADLT (ELECTROSURGICAL) ×1 IMPLANT
EXTRACTOR VACUUM M CUP 4 TUBE (SUCTIONS) IMPLANT
GLOVE BIOGEL PI IND STRL 7.0 (GLOVE) ×2 IMPLANT
GLOVE BIOGEL PI INDICATOR 7.0 (GLOVE) ×2
GLOVE SURG SS PI 6.5 STRL IVOR (GLOVE) ×2 IMPLANT
GOWN STRL REUS W/TWL LRG LVL3 (GOWN DISPOSABLE) ×4 IMPLANT
KIT ABG SYR 3ML LUER SLIP (SYRINGE) IMPLANT
LIQUID BAND (GAUZE/BANDAGES/DRESSINGS) ×1 IMPLANT
NDL HYPO 25X5/8 SAFETYGLIDE (NEEDLE) IMPLANT
NEEDLE HYPO 25X5/8 SAFETYGLIDE (NEEDLE) IMPLANT
NS IRRIG 1000ML POUR BTL (IV SOLUTION) ×2 IMPLANT
PACK C SECTION WH (CUSTOM PROCEDURE TRAY) ×2 IMPLANT
PAD ABD 7.5X8 STRL (GAUZE/BANDAGES/DRESSINGS) ×1 IMPLANT
PAD OB MATERNITY 4.3X12.25 (PERSONAL CARE ITEMS) ×2 IMPLANT
PENCIL SMOKE EVAC W/HOLSTER (ELECTROSURGICAL) ×2 IMPLANT
RTRCTR C-SECT PINK 25CM LRG (MISCELLANEOUS) ×2 IMPLANT
SPONGE DRAIN TRACH 4X4 STRL 2S (GAUZE/BANDAGES/DRESSINGS) ×2 IMPLANT
SUT CHROMIC 1 CTX 36 (SUTURE) IMPLANT
SUT CHROMIC 2 0 CT 1 (SUTURE) ×2 IMPLANT
SUT MON AB 4-0 PS1 27 (SUTURE) ×2 IMPLANT
SUT PLAIN 1 NONE 54 (SUTURE) IMPLANT
SUT PLAIN 2 0 (SUTURE)
SUT PLAIN 2 0 XLH (SUTURE) ×1 IMPLANT
SUT PLAIN ABS 2-0 CT1 27XMFL (SUTURE) IMPLANT
SUT VIC AB 0 CTX 36 (SUTURE) ×4
SUT VIC AB 0 CTX36XBRD ANBCTRL (SUTURE) ×1 IMPLANT
SUT VIC AB 1 CTX 36 (SUTURE) ×4
SUT VIC AB 1 CTX36XBRD ANBCTRL (SUTURE) ×2 IMPLANT
TOWEL OR 17X24 6PK STRL BLUE (TOWEL DISPOSABLE) ×2 IMPLANT
TRAY FOLEY CATH SILVER 14FR (SET/KITS/TRAYS/PACK) ×2 IMPLANT

## 2016-02-03 NOTE — Anesthesia Preprocedure Evaluation (Addendum)
Anesthesia Evaluation  Patient identified by MRN, date of birth, ID band Patient awake    Reviewed: Allergy & Precautions, NPO status , Patient's Chart, lab work & pertinent test results  Airway Mallampati: I  TM Distance: >3 FB Neck ROM: Full    Dental  (+) Teeth Intact, Dental Advisory Given   Pulmonary Current Smoker,    breath sounds clear to auscultation       Cardiovascular  Rhythm:Regular Rate:Normal     Neuro/Psych    GI/Hepatic GERD  Medicated and Controlled,  Endo/Other  Morbid obesity  Renal/GU      Musculoskeletal   Abdominal   Peds  Hematology   Anesthesia Other Findings Pt itches with Fentanyl  Reproductive/Obstetrics                             Anesthesia Physical Anesthesia Plan  ASA: II  Anesthesia Plan: Epidural   Post-op Pain Management:    Induction:   Airway Management Planned:   Additional Equipment:   Intra-op Plan:   Post-operative Plan:   Informed Consent: I have reviewed the patients History and Physical, chart, labs and discussed the procedure including the risks, benefits and alternatives for the proposed anesthesia with the patient or authorized representative who has indicated his/her understanding and acceptance.   Dental advisory given  Plan Discussed with: CRNA and Surgeon  Anesthesia Plan Comments: (For C/S with epidural in place)       Anesthesia Quick Evaluation                                   Anesthesia Evaluation  Patient identified by MRN, date of birth, ID band Patient awake    Reviewed: Allergy & Precautions, NPO status , Patient's Chart, lab work & pertinent test results  Airway Mallampati: I  TM Distance: >3 FB Neck ROM: Full    Dental  (+) Teeth Intact, Dental Advisory Given   Pulmonary Current Smoker,    breath sounds clear to auscultation       Cardiovascular  Rhythm:Regular Rate:Normal      Neuro/Psych    GI/Hepatic GERD  Medicated and Controlled,  Endo/Other  Morbid obesity  Renal/GU      Musculoskeletal   Abdominal   Peds  Hematology   Anesthesia Other Findings Pt itches with Fentanyl  Reproductive/Obstetrics                             Anesthesia Physical Anesthesia Plan  ASA: II  Anesthesia Plan: Epidural   Post-op Pain Management:    Induction:   Airway Management Planned:   Additional Equipment:   Intra-op Plan:   Post-operative Plan:   Informed Consent: I have reviewed the patients History and Physical, chart, labs and discussed the procedure including the risks, benefits and alternatives for the proposed anesthesia with the patient or authorized representative who has indicated his/her understanding and acceptance.   Dental advisory given  Plan Discussed with: Anesthesiologist  Anesthesia Plan Comments:         Anesthesia Quick Evaluation

## 2016-02-03 NOTE — Anesthesia Postprocedure Evaluation (Signed)
Anesthesia Post Note  Patient: Wendy Park  Procedure(s) Performed: Procedure(s) (LRB): CESAREAN SECTION (N/A)  Patient location during evaluation: PACU Anesthesia Type: Epidural Level of consciousness: awake Pain management: pain level controlled Vital Signs Assessment: post-procedure vital signs reviewed and stable Respiratory status: spontaneous breathing Cardiovascular status: stable Postop Assessment: no headache, no backache, epidural receding and no signs of nausea or vomiting Anesthetic complications: no     Last Vitals:  Vitals:   02/03/16 1830 02/03/16 1903  BP: 118/69 102/87  Pulse: 92 (!) 121  Resp: 20 20  Temp:  36.8 C    Last Pain:  Vitals:   02/03/16 1903  TempSrc: Oral  PainSc:    Pain Goal: Patients Stated Pain Goal: 4 (02/03/16 0912)               Jaquel Glassburn JR,JOHN Susann Givens

## 2016-02-03 NOTE — Anesthesia Procedure Notes (Deleted)
Anesthesia Regional Block: Narrative:       

## 2016-02-03 NOTE — Progress Notes (Signed)
  Subjective: More uncomfortable with UCs--has received 2 doses IV Fentanyl with some benefit.  Desires epidural.  Objective: BP 108/75   Pulse 71   Temp 97.4 F (36.3 C) (Oral) Comment: drinking fluid  Resp 16   Ht 5\' 2"  (1.575 m)   Wt 85.3 kg (188 lb)   SpO2 98%   BMI 34.39 kg/m  No intake/output data recorded. No intake/output data recorded.  FHT: Category 1 UC:   regular, every 4 minutes SVE:   Dilation: 2.5 Effacement (%): 80 Station: -2 Exam by:: V Kam Kushnir Pitocin at 19 mu/min  Assessment:  PROM at term x 12 hours Early labor GBS negative Anxiety/OCD--on Zoloft and Vistaril  Plan: Proceed with epidural.  Wendy Park CNM 02/03/2016, 6:41 AM

## 2016-02-03 NOTE — Op Note (Signed)
Patient: Dmani, Franceschini DOB: [birth year] MRN:  [id number]  DATE OF SURGERY: 02/03/2016  PREOP DIAGNOSIS:  1. 39week 6 day EGA intrauterine pregnancy 2. Failure to progress 3.  Suspect cephalopelvic disproportion.    POSTOP DIAGNOSIS: Same as above.  PROCEDURE: Primary low uterine segment transverse cesarean section via Pfannenstiel incision.     SURGEON: Dr.  Hoover Browns  ASSISTANT: CNM Sherre Scarlet  ANESTHESIA: Epidural  COMPLICATIONS: None  FINDINGS: Viable female infant with compound presentation of hand and head, loose nuchal cord, DOA position, weight 7 pounds 9.5 ounces, Apgar scores of 10 and 10. Normal uterus and fallopian tubes and ovaries bilaterally.    EBL:  800 cc  IV FLUID:  3000 cc LR   URINE OUTPUT: 200 cc clear urine  INDICATIONS: 20 y/o P0 who presented with premature rupture of membranes. She received pitocin for labor augmentation got to active phase of labor and had minimal change over 6 hrs where she progressed from 8cm to 9cm only.  I was also concerned for cephalo pelvic disproportion therefore offered cesarean delivery.  She was consented for the procedure after explaining risks benefits and alternatives of the procedure including but not limited to heavy bleeding, infection, damage to organs.    PROCEDURE:   She was taken to the operating room where her epidural anesthesia was found to be adequate. She was prepped and draped in the usual sterile fashion and a Foley catheter was placed. She received 2 g of IV Ancef preoperatively. A Pfannenstiel incision was made with the scalpel and the incision extended through the subcutaneous layer and also the fascia with the bovie. Small perforators in the subcutaneous layer were contained with the Bovie. The fascia was nicked in the midline and then was further separated from the rectus muscles bilaterally using Mayo scissors. Kochers were placed inferiorly and then superiorly to allow further separation of fascia  from the rectus muscles.  The peritoneal cavity was entered bluntly with the fingers. The Alexis retractor was placed in. The bladder flap was created using Metzenbaum scissors.   The uterus was incised with a scalpel and the incision extended bluntly bilaterally with fingers. Clear amniotic fluid was noted.  Baby's hand was encountered upon uterine entry.  This was pushed back in then head ws flexed and delivered thorough the incision, compound presentation noted with the hand and head at delivery.  Loose nuchal  Cord was reduced then the rest of the body was delivered atraumatically.  She delivered a viable female infant, apgar scores 10, 10.  The cord was clamped and cut. Cord blood was collected.    The uterus was not exteriorized.  The placenta was delivered with gentle traction on the umbilical cord. The edges of the uterus was grasped with clamps.  The uterus was cleared of clots and debris with a lap.  The incision uterine incision was closed with #1 Vicryl in a running locked stitch while ensuring incorporation of the right edge of incision which extended laterally but not to the blood vessels.  An imbricating layer of same stitch was placed over the initial closure.  A 3 cm stable hematoma was noted above left side of incision.   Irrigation was applied and suctioned out. Excellent hemostasis was noted over the incision.  Adnexa was explored and noted as above.    The muscles were then reapproximated using chromic suture in interrupted stitches.  Fascia was closed using 0 Vicryl in a running stitch. The subcutaneous layer was irrigated and suctioned  out. Small perforators were contained with the bovie.  The subcutaneous was closed over using 1-0 plain in interrupted stitches. The skin was closed using 4-0 Monocryl. Dermabond was applied. Honeycomb and pressure dressing were then applied. The patient was then cleaned and she was taken to the recovery room in stable condition. The neonate was also taken  to the nursery in stable condition.   SPECIMEN: Umbilical cord blood  DISPOSITION: TO PACU, STABLE.    Hoover Browns, MD.

## 2016-02-03 NOTE — Interval H&P Note (Signed)
History and Physical Interval Note:  02/03/2016 7:03 PM  Wendy Park  has presented today for surgery, with the diagnosis of Failure to progress, cephalopelvic pelvic disproportion.  The various methods of treatment have been discussed with the patient and family. After consideration of risks, benefits and other options for treatment, the patient has consented to  Procedure(s): CESAREAN SECTION (N/A) as a surgical intervention .  The patient's history has been reviewed, patient examined, no change in status, stable for surgery.  I have reviewed the patient's chart and labs.  Questions were answered to the patient's satisfaction.     [facility]

## 2016-02-03 NOTE — Anesthesia Procedure Notes (Deleted)
Epidural

## 2016-02-03 NOTE — Progress Notes (Signed)
  Subjective: Aware of some cramping--3/10 on pain scale.  Visiting with family/friends.  Had requested nicotine patch.  Considering pain med.    Usually takes Zoloft and Vistaril at 3 am.  Objective: BP 115/76   Pulse 86   Temp 97.6 F (36.4 C) (Oral)   Resp 16   Ht 5\' 2"  (1.575 m)   Wt 85.3 kg (188 lb)   BMI 34.39 kg/m  No intake/output data recorded. No intake/output data recorded.  FHT: Category 1 UC:   q 3-4 min, mild/mod SVE:   Dilation: 1.5 Effacement (%): 70 Station: -2 Exam by:: V Udell Mazzocco at 10:12p Pitocin at 13 mu/min.  Assessment:  PROM at term x 8 1/2 hours Smoker Anxiety/OCD  Plan: Continue pitocin augmentation. Reviewed pain med options--recommended IV med/nitrous in early labor, epidural as labor progresses. Zoloft 100 mg po and Vistaril 50 mg now.  Nigel Bridgeman CNM 02/03/2016, 2:28 AM

## 2016-02-03 NOTE — Progress Notes (Signed)
Wendy Park is a 20 y.o. G1P0000 at [redacted]w[redacted]d admitted for rupture of membranes  Subjective: Patient comfortable with epidural.    Objective: BP (!) 105/57   Pulse 80   Temp 97.8 F (36.6 C) (Oral)   Resp 20   Ht 5\' 2"  (1.575 m)   Wt 85.3 kg (188 lb)   SpO2 97%   BMI 34.39 kg/m  No intake/output data recorded. Total I/O In: -  Out: 150 [Urine:150]  FHT:  FHR: 120 bpm, variability: moderate,  accelerations:  Present,  decelerations:  Absent UC:   regular, every 2- 4 minutes minutes SVE:   Dilation: 4 Effacement (%): 90 Station: -1, -2 Exam by:: Wendy Slipper, RN  Pitocin 25 milliunits/minute.   Labs: Lab Results  Component Value Date   WBC 9.1 02/02/2016   HGB 11.4 (L) 02/02/2016   HCT 32.5 (L) 02/02/2016   MCV 82.7 02/02/2016   PLT 196 02/02/2016   . hydrOXYzine  50 mg Oral TID  . nicotine  14 mg Transdermal Q24H  . oxytocin 40 units in LR 1000 mL  500 mL Intravenous Once  . sertraline  100 mg Oral Daily    Assessment / Plan: Augmentation of labor, progressing well  Labor: Progressing on pitocin after spontaneous rupture of membranes, ruptured 02/02/16 at 1700.  Fetal Wellbeing:  Category I Pain Control:  Epidural Anticipated MOD:  NSVD  Wendy Park WAKURU 02/03/2016, 11:36 AM

## 2016-02-03 NOTE — Transfer of Care (Signed)
Immediate Anesthesia Transfer of Care Note  Patient: Wendy Park  Procedure(s) Performed: Procedure(s): CESAREAN SECTION (N/A)  Patient Location: PACU  Anesthesia Type:Epidural  Level of Consciousness: awake  Airway & Oxygen Therapy: Patient Spontanous Breathing  Post-op Assessment: Report given to RN and Post -op Vital signs reviewed and stable  Post vital signs: stable  Last Vitals:  Vitals:   02/03/16 1830 02/03/16 1903  BP: 118/69 102/87  Pulse: 92 (!) 121  Resp: 20 20  Temp:  36.8 C    Last Pain:  Vitals:   02/03/16 1903  TempSrc: Oral  PainSc:       Patients Stated Pain Goal: 4 (02/03/16 0912)  Complications: No apparent anesthesia complications

## 2016-02-03 NOTE — Progress Notes (Signed)
Carlin Madero is a 20 y.o. G1P0000 at [redacted]w[redacted]d admitted for rupture of membranes  Subjective: Patient feeling strong contractions.   Objective: BP 118/69   Pulse 92   Temp 98.3 F (36.8 C) (Oral)   Resp 20   Ht 5\' 2"  (1.575 m)   Wt 85.3 kg (188 lb)   SpO2 97%   BMI 34.39 kg/m  No intake/output data recorded. Total I/O In: -  Out: 150 [Urine:150]  FHT:  Cat 1 UC:   regular, every 1- 3  minutes SVE:   Dilation: 9 Effacement (%): 100 Station: 0 Exam by:: DR. Sallye Ober  Labs: Lab Results  Component Value Date   WBC 9.1 02/02/2016   HGB 11.4 (L) 02/02/2016   HCT 32.5 (L) 02/02/2016   MCV 82.7 02/02/2016   PLT 196 02/02/2016    Assessment / Plan: Inadequate labor progress, was 8cm at 1pm today.  I suspect cephalopelvic disproportion.  I discussed with patient further management options and will proceed with cesarean delivery.  We discussed risks, benefits and alternatives of cesarean section including risks of bleeding, infection, damage to organs.  All her questions were answered and she was consented for the procedure.    [facility] 02/03/2016, 6:58 PM

## 2016-02-03 NOTE — H&P (View-Only) (Signed)
Wendy Park is a 20 y.o. G1P0000 at [redacted]w[redacted]d admitted for rupture of membranes  Subjective: Patient feeling strong contractions.   Objective: BP 118/69   Pulse 92   Temp 98.3 F (36.8 C) (Oral)   Resp 20   Ht 5' 2" (1.575 m)   Wt 85.3 kg (188 lb)   SpO2 97%   BMI 34.39 kg/m  No intake/output data recorded. Total I/O In: -  Out: 150 [Urine:150]  FHT:  Cat 1 UC:   regular, every 1- 3  minutes SVE:   Dilation: 9 Effacement (%): 100 Station: 0 Exam by:: DR. Akiyah Eppolito  Labs: Lab Results  Component Value Date   WBC 9.1 02/02/2016   HGB 11.4 (L) 02/02/2016   HCT 32.5 (L) 02/02/2016   MCV 82.7 02/02/2016   PLT 196 02/02/2016    Assessment / Plan: Inadequate labor progress, was 8cm at 1pm today.  I suspect cephalopelvic disproportion.  I discussed with patient further management options and will proceed with cesarean delivery.  We discussed risks, benefits and alternatives of cesarean section including risks of bleeding, infection, damage to organs.  All her questions were answered and she was consented for the procedure.    Merwin Breden WAKURU 02/03/2016, 6:58 PM  

## 2016-02-03 NOTE — Brief Op Note (Signed)
02/03/2016  8:50 PM  PATIENT:  Wendy Park  20 y.o. female  PRE-OPERATIVE DIAGNOSIS:  Suspect cephalopelvic disproportion; Failure to Progress  POST-OPERATIVE DIAGNOSIS:  Suspect cephalopelvic disproportion; Failure to Progress  PROCEDURE:  Procedure(s): CESAREAN SECTION (N/A) Primary Lower Uterine Segment Tranverse.   SURGEON:  Surgeon(s) and Role:    * Hoover Browns, MD - Primary   ASSISTANTS: Sherre Scarlet, CNM  ANESTHESIA:   epidural  EBL:  Total I/O In: 3000 [I.V.:3000] Out: 1000 [Urine:200; Blood:800]  BLOOD ADMINISTERED:none  DRAINS: none   LOCAL MEDICATIONS USED:  NONE  SPECIMEN:  Source of Specimen:  Cord blood  DISPOSITION OF SPECIMEN:  PATHOLOGY  COUNTS:  YES  TOURNIQUET:  * No tourniquets in log *  DICTATION: .Note written in EPIC  PLAN OF CARE: Admit to inpatient   PATIENT DISPOSITION:  PACU - hemodynamically stable.   Delay start of Pharmacological VTE agent (>24hrs) due to surgical blood loss or risk of bleeding: not applicable

## 2016-02-03 NOTE — Anesthesia Procedure Notes (Addendum)
Epidural Patient location during procedure: OB Start time: 02/03/2016 6:40 AM  Staffing Anesthesiologist: CREWS, DAVID  Preanesthetic Checklist Completed: patient identified, site marked, surgical consent, pre-op evaluation, timeout performed, IV checked, risks and benefits discussed and monitors and equipment checked  Epidural Patient position: sitting Prep: site prepped and draped and DuraPrep Patient monitoring: continuous pulse ox and blood pressure Approach: midline Location: L3-L4 Injection technique: LOR air  Needle:  Needle type: Tuohy  Needle gauge: 17 G Needle length: 9 cm and 9 Needle insertion depth: 6 cm Catheter type: closed end flexible Catheter size: 19 Gauge Catheter at skin depth: 10 cm Test dose: negative  Assessment Events: blood not aspirated, injection not painful, no injection resistance, negative IV test and no paresthesia  Additional Notes Catheter was in 10cm leading to a one -sided block. The catheter was pulled back to 11cm from 16cm. The patient now has a less pronounced one sided block with the right side level adequate for Csection.

## 2016-02-04 ENCOUNTER — Encounter (HOSPITAL_COMMUNITY): Payer: Self-pay | Admitting: Obstetrics & Gynecology

## 2016-02-04 LAB — CBC
HEMATOCRIT: 24.7 % — AB (ref 36.0–46.0)
HEMOGLOBIN: 8.7 g/dL — AB (ref 12.0–15.0)
MCH: 29 pg (ref 26.0–34.0)
MCHC: 35.2 g/dL (ref 30.0–36.0)
MCV: 82.3 fL (ref 78.0–100.0)
Platelets: 177 10*3/uL (ref 150–400)
RBC: 3 MIL/uL — ABNORMAL LOW (ref 3.87–5.11)
RDW: 13.4 % (ref 11.5–15.5)
WBC: 16.3 10*3/uL — ABNORMAL HIGH (ref 4.0–10.5)

## 2016-02-04 MED ORDER — OXYCODONE HCL 5 MG PO TABS
5.0000 mg | ORAL_TABLET | ORAL | Status: DC | PRN
  Administered 2016-02-04 – 2016-02-06 (×10): 5 mg via ORAL
  Filled 2016-02-04 (×10): qty 1

## 2016-02-04 NOTE — Lactation Note (Signed)
This note was copied from a baby's chart. Lactation Consultation Note: Mother was given [facility] Brochure. Basic teaching done using Baby and Me book. Advised mother to hand express colostrum. Observed several drops of colostrum. Infant has had multiple attempts to latch with feeding only 3-5 mins.  Assist mother with latching infant on in cross-cradle hold. Infant latched on without difficulty. Observed infant with good burst of rhythmic suckling. Advised mother to feed infant 8-12 times in 24 hours as well as with feeding cue. Mother informed of available Lactation services. Suggested that parent do frequent skin to skin.  Patient Name: Wendy Park [id number] Date: 02/04/2016 Reason for consult: Initial assessment   Maternal Data Has patient been taught Hand Expression?: Yes Does the patient have breastfeeding experience prior to this delivery?: No  Feeding Feeding Type: Breast Fed Length of feed: 0 min  LATCH Score/Interventions Latch: Grasps breast easily, tongue down, lips flanged, rhythmical sucking. Intervention(s): Adjust position;Assist with latch;Breast compression  Audible Swallowing: A few with stimulation Intervention(s): Skin to skin;Hand expression  Type of Nipple: Everted at rest and after stimulation  Comfort (Breast/Nipple): Soft / non-tender     Hold (Positioning): Assistance needed to correctly position infant at breast and maintain latch. Intervention(s): Breastfeeding basics reviewed;Support Pillows;Position options;Skin to skin  LATCH Score: 8  Lactation Tools Discussed/Used     Consult Status Consult Status: Follow-up Date: 02/04/16 Follow-up type: In-patient    Stevan Born [facility] 02/04/2016, 3:25 PM

## 2016-02-04 NOTE — Progress Notes (Signed)
Assisted pt up to bathroom, peri care taught, pads changed, pt tolerated ambulation well. Instructed to use incentive spirometer q hour while awake. Pt verbalized understanding.

## 2016-02-04 NOTE — Anesthesia Postprocedure Evaluation (Signed)
Anesthesia Post Note  Patient: Wendy Park  Procedure(s) Performed: Procedure(s) (LRB): CESAREAN SECTION (N/A)  Patient location during evaluation: Mother Baby Anesthesia Type: Epidural Level of consciousness: awake and alert Pain management: pain level controlled Vital Signs Assessment: post-procedure vital signs reviewed and stable Respiratory status: spontaneous breathing, nonlabored ventilation and respiratory function stable Cardiovascular status: stable Postop Assessment: no headache, no backache and epidural receding Anesthetic complications: no     Last Vitals:  Vitals:   02/04/16 0100 02/04/16 0500  BP: (!) 103/54 (!) 106/47  Pulse: 84 63  Resp: 18 20  Temp: 36.7 C 36.8 C    Last Pain:  Vitals:   02/04/16 0500  TempSrc: Oral  PainSc:    Pain Goal: Patients Stated Pain Goal: 4 (02/03/16 0912)               Junious Silk

## 2016-02-04 NOTE — Addendum Note (Signed)
Addendum  created 02/04/16 0749 by Junious Silk, CRNA   Charge Capture section accepted, Sign clinical note

## 2016-02-04 NOTE — Progress Notes (Signed)
MOB was referred for history of depression/anxiety. * Referral screened out by Clinical Social Worker because none of the following criteria appear to apply: ~ History of anxiety/depression during this pregnancy, or of post-partum depression. ~ Diagnosis of anxiety and/or depression within last 3 years OR * MOB's symptoms currently being treated with medication and/or therapy.  Per CSW chart review it is noted that MOB is currently seeing Karen Jones for behavioral health counseling and has a current prescription for Zoloft.   Please contact the Clinical Social Worker if needs arise, or if MOB requests.  Falecia Vannatter Boyd-Gilyard, MSW, LCSW Clinical Social Work [phone] 

## 2016-02-04 NOTE — Progress Notes (Signed)
UR chart review completed.  

## 2016-02-04 NOTE — Progress Notes (Signed)
Subjective:  Postpartum Day 1: Cesarean Delivery Patient reports incisional pain, tolerating PO and no problems voiding.    Objective: Vital signs in last 24 hours: Temp:  [98 F (36.7 C)-99.5 F (37.5 C)] 98.1 F (36.7 C) (08/04 1330) Pulse Rate:  [58-121] 62 (08/04 1330) Resp:  [0-32] 20 (08/04 1330) BP: (96-128)/(47-87) 98/52 (08/04 1330) SpO2:  [95 %-100 %] 96 % (08/04 0100)  Physical Exam:  General: alert and no distress Lochia: appropriate Uterine Fundus: firm Incision: Dressing has a small bloodstained. Stable. Incision intact DVT Evaluation: No evidence of DVT seen on physical exam.   Recent Labs  02/02/16 1750 02/04/16 0502  HGB 11.4* 8.7*  HCT 32.5* 24.7*    Assessment/Plan: Status post Cesarean section. Doing well postoperatively.  Anemia from blood loss at surgery. Continue current care.  Janine Limbo 02/04/2016, 5:26 PM

## 2016-02-05 LAB — BIRTH TISSUE RECOVERY COLLECTION (PLACENTA DONATION)

## 2016-02-06 MED ORDER — IBUPROFEN 600 MG PO TABS: 600.0000 mg | ORAL_TABLET | Freq: Four times a day (QID) | ORAL | 2 refills | Status: DC | PRN

## 2016-02-06 MED ORDER — FERROUS SULFATE 325 (65 FE) MG PO TABS: 325.0000 mg | ORAL_TABLET | Freq: Two times a day (BID) | ORAL | 3 refills | Status: DC

## 2016-02-06 MED ORDER — OXYCODONE HCL 5 MG PO TABS: 5.0000 mg | ORAL_TABLET | ORAL | 0 refills | Status: DC | PRN

## 2016-02-06 MED ORDER — NORETHINDRONE 0.35 MG PO TABS: 1.0000 | ORAL_TABLET | Freq: Every day | ORAL | 11 refills | Status: DC

## 2016-02-06 NOTE — Lactation Note (Signed)
This note was copied from a baby's chart. Lactation Consultation Note: Mom reports baby just fed for 15 min about 1 hour ago. Is trying to wake baby for other breast. Encouraged mom to unwrap and undress  Baby. Mom easily able to hand express Colostrum. Baby latched well and nursed for 15 min then off to sleep. Encouragement given. Mom concerned about weight loss- has given a couple of bottles of formula through the night.  Encouraged to always breast feed first- to try both breasts then offer EBM or formula. Reports she has pumped once yesterday with DEBP. States she is planning on getting pump from Medicaid through her OB office. Does not have WIC. Discussed options about pump for home. Parents will think about it and let me know, No further questions at present. Reviewed our phone number to call with questions. OP appointments and BFSG as resources after DC. To call prn   Patient Name: Girl Morton AmyKaitlyn Michelini YNWGN'FToday's Date: 02/06/2016 Reason for consult: Follow-up assessment   Maternal Data Formula Feeding for Exclusion: No Has patient been taught Hand Expression?: Yes Does the patient have breastfeeding experience prior to this delivery?: No  Feeding Feeding Type: Breast Fed Length of feed: 15 min  LATCH Score/Interventions Latch: Grasps breast easily, tongue down, lips flanged, rhythmical sucking.  Audible Swallowing: A few with stimulation Intervention(s): Hand expression  Type of Nipple: Everted at rest and after stimulation  Comfort (Breast/Nipple): Filling, red/small blisters or bruises, mild/mod discomfort  Problem noted: Mild/Moderate discomfort Interventions (Mild/moderate discomfort): Comfort gels;Hand expression  Hold (Positioning): Assistance needed to correctly position infant at breast and maintain latch. Intervention(s): Breastfeeding basics reviewed  LATCH Score: 7  Lactation Tools Discussed/Used WIC Program: No Pump Review: Setup, frequency, and  cleaning   Consult Status Consult Status: Complete    Pamelia HoitWeeks, Izumi Mixon D 02/06/2016, 8:40 AM

## 2016-02-06 NOTE — Progress Notes (Signed)
Patient reports non-productive, infrequent cough. Lungs ausculated: clear at bases bilaterally. Mild expiratory wheezing noted over upper lobes. Few fine rales ausculated over left upper lobe. Upon instruction, patient used inspirometer, inspiring to level of 1750 consistently. No coughing prompted. Patient encouraged to use the inspirometer every hour today while awake and to use Albuterol inhaler as needed and directed for chest tightness/wheezing. Patient states that she is interested in starting the Nicotine Patch again.

## 2016-02-06 NOTE — Discharge Instructions (Signed)
Contraception Choices Contraception (birth control) is the use of any methods or devices to prevent pregnancy. Below are some methods to help avoid pregnancy. HORMONAL METHODS   Contraceptive implant. This is a thin, plastic tube containing progesterone hormone. It does not contain estrogen hormone. Your health care provider inserts the tube in the inner part of the upper arm. The tube can remain in place for up to 3 years. After 3 years, the implant must be removed. The implant prevents the ovaries from releasing an egg (ovulation), thickens the cervical mucus to prevent sperm from entering the uterus, and thins the lining of the inside of the uterus.  Progesterone-only injections. These injections are given every 3 months by your health care provider to prevent pregnancy. This synthetic progesterone hormone stops the ovaries from releasing eggs. It also thickens cervical mucus and changes the uterine lining. This makes it harder for sperm to survive in the uterus.  Birth control pills. These pills contain estrogen and progesterone hormone. They work by preventing the ovaries from releasing eggs (ovulation). They also cause the cervical mucus to thicken, preventing the sperm from entering the uterus. Birth control pills are prescribed by a health care provider.Birth control pills can also be used to treat heavy periods.  Minipill. This type of birth control pill contains only the progesterone hormone. They are taken every day of each month and must be prescribed by your health care provider.  Birth control patch. The patch contains hormones similar to those in birth control pills. It must be changed once a week and is prescribed by a health care provider.  Vaginal ring. The ring contains hormones similar to those in birth control pills. It is left in the vagina for 3 weeks, removed for 1 week, and then a new one is put back in place. The patient must be comfortable inserting and removing the ring  from the vagina.A health care provider's prescription is necessary.  Emergency contraception. Emergency contraceptives prevent pregnancy after unprotected sexual intercourse. This pill can be taken right after sex or up to 5 days after unprotected sex. It is most effective the sooner you take the pills after having sexual intercourse. Most emergency contraceptive pills are available without a prescription. Check with your pharmacist. Do not use emergency contraception as your only form of birth control. BARRIER METHODS   Female condom. This is a thin sheath (latex or rubber) that is worn over the penis during sexual intercourse. It can be used with spermicide to increase effectiveness.  Female condom. This is a soft, loose-fitting sheath that is put into the vagina before sexual intercourse.  Diaphragm. This is a soft, latex, dome-shaped barrier that must be fitted by a health care provider. It is inserted into the vagina, along with a spermicidal jelly. It is inserted before intercourse. The diaphragm should be left in the vagina for 6 to 8 hours after intercourse.  Cervical cap. This is a round, soft, latex or plastic cup that fits over the cervix and must be fitted by a health care provider. The cap can be left in place for up to 48 hours after intercourse.  Sponge. This is a soft, circular piece of polyurethane foam. The sponge has spermicide in it. It is inserted into the vagina after wetting it and before sexual intercourse.  Spermicides. These are chemicals that kill or block sperm from entering the cervix and uterus. They come in the form of creams, jellies, suppositories, foam, or tablets. They do not require a  prescription. They are inserted into the vagina with an applicator before having sexual intercourse. The process must be repeated every time you have sexual intercourse. INTRAUTERINE CONTRACEPTION  Intrauterine device (IUD). This is a T-shaped device that is put in a woman's uterus  during a menstrual period to prevent pregnancy. There are 2 types:  Copper IUD. This type of IUD is wrapped in copper wire and is placed inside the uterus. Copper makes the uterus and fallopian tubes produce a fluid that kills sperm. It can stay in place for 10 years.  Hormone IUD. This type of IUD contains the hormone progestin (synthetic progesterone). The hormone thickens the cervical mucus and prevents sperm from entering the uterus, and it also thins the uterine lining to prevent implantation of a fertilized egg. The hormone can weaken or kill the sperm that get into the uterus. It can stay in place for 3-5 years, depending on which type of IUD is used. PERMANENT METHODS OF CONTRACEPTION  Female tubal ligation. This is when the woman's fallopian tubes are surgically sealed, tied, or blocked to prevent the egg from traveling to the uterus.  Hysteroscopic sterilization. This involves placing a small coil or insert into each fallopian tube. Your doctor uses a technique called hysteroscopy to do the procedure. The device causes scar tissue to form. This results in permanent blockage of the fallopian tubes, so the sperm cannot fertilize the egg. It takes about 3 months after the procedure for the tubes to become blocked. You must use another form of birth control for these 3 months.  Female sterilization. This is when the female has the tubes that carry sperm tied off (vasectomy).This blocks sperm from entering the vagina during sexual intercourse. After the procedure, the man can still ejaculate fluid (semen). NATURAL PLANNING METHODS  Natural family planning. This is not having sexual intercourse or using a barrier method (condom, diaphragm, cervical cap) on days the woman could become pregnant.  Calendar method. This is keeping track of the length of each menstrual cycle and identifying when you are fertile.  Ovulation method. This is avoiding sexual intercourse during ovulation.  Symptothermal  method. This is avoiding sexual intercourse during ovulation, using a thermometer and ovulation symptoms.  Post-ovulation method. This is timing sexual intercourse after you have ovulated. Regardless of which type or method of contraception you choose, it is important that you use condoms to protect against the transmission of sexually transmitted infections (STIs). Talk with your health care provider about which form of contraception is most appropriate for you.   This information is not intended to replace advice given to you by your health care provider. Make sure you discuss any questions you have with your health care provider.   Document Released: 06/19/2005 Document Revised: 06/24/2013 Document Reviewed: 12/12/2012 Elsevier Interactive Patient Education 2016 [employer]. Anemia, Nonspecific Anemia is a condition in which the concentration of red blood cells or hemoglobin in the blood is below normal. Hemoglobin is a substance in red blood cells that carries oxygen to the tissues of the body. Anemia results in not enough oxygen reaching these tissues.  CAUSES  Common causes of anemia include:   Excessive bleeding. Bleeding may be internal or external. This includes excessive bleeding from periods (in women) or from the intestine.   Poor nutrition.   Chronic kidney, thyroid, and liver disease.  Bone marrow disorders that decrease red blood cell production.  Cancer and treatments for cancer.  HIV, AIDS, and their treatments.  Spleen problems that  increase red blood cell destruction.  Blood disorders.  Excess destruction of red blood cells due to infection, medicines, and autoimmune disorders. SIGNS AND SYMPTOMS   Minor weakness.   Dizziness.   Headache.  Palpitations.   Shortness of breath, especially with exercise.   Paleness.  Cold sensitivity.  Indigestion.  Nausea.  Difficulty sleeping.  Difficulty concentrating. Symptoms may occur suddenly or  they may develop slowly.  DIAGNOSIS  Additional blood tests are often needed. These help your health care provider determine the best treatment. Your health care provider will check your stool for blood and look for other causes of blood loss.  TREATMENT  Treatment varies depending on the cause of the anemia. Treatment can include:   Supplements of iron, vitamin K53, or folic acid.   Hormone medicines.   A blood transfusion. This may be needed if blood loss is severe.   Hospitalization. This may be needed if there is significant continual blood loss.   Dietary changes.  Spleen removal. HOME CARE INSTRUCTIONS Keep all follow-up appointments. It often takes many weeks to correct anemia, and having your health care provider check on your condition and your response to treatment is very important. SEEK IMMEDIATE MEDICAL CARE IF:   You develop extreme weakness, shortness of breath, or chest pain.   You become dizzy or have trouble concentrating.  You develop heavy vaginal bleeding.   You develop a rash.   You have bloody or black, tarry stools.   You faint.   You vomit up blood.   You vomit repeatedly.   You have abdominal pain.  You have a fever or persistent symptoms for more than 2-3 days.   You have a fever and your symptoms suddenly get worse.   You are dehydrated.  MAKE SURE YOU:  Understand these instructions.  Will watch your condition.  Will get help right away if you are not doing well or get worse.   This information is not intended to replace advice given to you by your health care provider. Make sure you discuss any questions you have with your health care provider.   Document Released: 07/27/2004 Document Revised: 02/19/2013 Document Reviewed: 12/13/2012 Elsevier Interactive Patient Education 2016 [employer]. Iron-Rich Diet Iron is a mineral that helps your body to produce hemoglobin. Hemoglobin is a protein in your red blood cells  that carries oxygen to your body's tissues. Eating too little iron may cause you to feel weak and tired, and it can increase your risk for infection. Eating enough iron is necessary for your body's metabolism, muscle function, and nervous system. Iron is naturally found in many foods. It can also be added to foods or fortified in foods. There are two types of dietary iron:  Heme iron. Heme iron is absorbed by the body more easily than nonheme iron. Heme iron is found in meat, poultry, and fish.  Nonheme iron. Nonheme iron is found in dietary supplements, iron-fortified grains, beans, and vegetables. You may need to follow an iron-rich diet if:  You have been diagnosed with iron deficiency or iron-deficiency anemia.  You have a condition that prevents you from absorbing dietary iron, such as:  Infection in your intestines.  Celiac disease. This involves long-lasting (chronic) inflammation of your intestines.  You do not eat enough iron.  You eat a diet that is high in foods that impair iron absorption.  You have lost a lot of blood.  You have heavy bleeding during your menstrual cycle.  You are pregnant.  WHAT IS MY PLAN? Your health care provider may help you to determine how much iron you need per day based on your condition. Generally, when a person consumes sufficient amounts of iron in the diet, the following iron needs are met:  Men.  79-57 years old: 11 mg per day.  50-41 years old: 8 mg per day.  Women.   71-68 years old: 15 mg per day.  65-42 years old: 18 mg per day.  Over 10 years old: 8 mg per day.  Pregnant women: 27 mg per day.  Breastfeeding women: 9 mg per day. WHAT DO I NEED TO KNOW ABOUT AN IRON-RICH DIET?  Eat fresh fruits and vegetables that are high in vitamin C along with foods that are high in iron. This will help increase the amount of iron that your body absorbs from food, especially with foods containing nonheme iron. Foods that are high in  vitamin C include oranges, peppers, tomatoes, and mango.  Take iron supplements only as directed by your health care provider. Overdose of iron can be life-threatening. If you were prescribed iron supplements, take them with orange juice or a vitamin C supplement.  Cook foods in pots and pans that are made from iron.   Eat nonheme iron-containing foods alongside foods that are high in heme iron. This helps to improve your iron absorption.   Certain foods and drinks contain compounds that impair iron absorption. Avoid eating these foods in the same meal as iron-rich foods or with iron supplements. These include:  Coffee, black tea, and red wine.  Milk, dairy products, and foods that are high in calcium.  Beans, soybeans, and peas.  Whole grains.  When eating foods that contain both nonheme iron and compounds that impair iron absorption, follow these tips to absorb iron better.   Soak beans overnight before cooking.  Soak whole grains overnight and drain them before using.  Ferment flours before baking, such as using yeast in bread dough. WHAT FOODS CAN I EAT? Grains Iron-fortified breakfast cereal. Iron-fortified whole-wheat bread. Enriched rice. Sprouted grains. Vegetables Spinach. Potatoes with skin. Green peas. Broccoli. Red and green bell peppers. Fermented vegetables. Fruits Prunes. Raisins. Oranges. Strawberries. Mango. Grapefruit. Meats and Other Protein Sources Beef liver. Oysters. Beef. Shrimp. [place]. Chicken. [place]. Sardines. Chickpeas. Nuts. Tofu. Beverages Tomato juice. Fresh orange juice. Prune juice. Hibiscus tea. Fortified instant breakfast shakes. Condiments Tahini. Fermented soy sauce. Sweets and Desserts Black-strap molasses.  Other Wheat germ. The items listed above may not be a complete list of recommended foods or beverages. Contact your dietitian for more options. WHAT FOODS ARE NOT RECOMMENDED? Grains Whole grains. Bran cereal. Bran  flour. Oats. Vegetables Artichokes. Brussels sprouts. Kale. Fruits Blueberries. Raspberries. Strawberries. Figs. Meats and Other Protein Sources Soybeans. Products made from soy protein. Dairy Milk. Cream. Cheese. Yogurt. Cottage cheese. Beverages Coffee. Black tea. Red wine. Sweets and Desserts Cocoa. Chocolate. Ice cream. Other Basil. Oregano. Parsley. The items listed above may not be a complete list of foods and beverages to avoid. Contact your dietitian for more information.   This information is not intended to replace advice given to you by your health care provider. Make sure you discuss any questions you have with your health care provider.   Document Released: 01/31/2005 Document Revised: 07/10/2014 Document Reviewed: 01/14/2014 Elsevier Interactive Patient Education 2016 [employer] Breastfeeding and Mastitis Mastitis is inflammation of the breast tissue. It can occur in women who are breastfeeding. This can make breastfeeding painful. Mastitis will sometimes go  away on its own. Your health care provider will help determine if treatment is needed. CAUSES Mastitis is often associated with a blocked milk (lactiferous) duct. This can happen when too much milk builds up in the breast. Causes of excess milk in the breast can include:  Poor latch-on. If your baby is not latched onto the breast properly, she or he may not empty your breast completely while breastfeeding.  Allowing too much time to pass between feedings.  Wearing a bra or other clothing that is too tight. This puts extra pressure on the lactiferous ducts so milk does not flow through them as it should. Mastitis can also be caused by a bacterial infection. Bacteria may enter the breast tissue through cuts or openings in the skin. In women who are breastfeeding, this may occur because of cracked or irritated skin. Cracks in the skin are often caused when your baby does not latch on properly to the  breast. SIGNS AND SYMPTOMS  Swelling, redness, tenderness, and pain in an area of the breast.  Swelling of the glands under the arm on the same side.  Fever may or may not accompany mastitis. If an infection is allowed to progress, a collection of pus (abscess) may develop. DIAGNOSIS  Your health care provider can usually diagnose mastitis based on your symptoms and a physical exam. Tests may be done to help confirm the diagnosis. These may include:  Removal of pus from the breast by applying pressure to the area. This pus can be examined in the lab to determine which bacteria are present. If an abscess has developed, the fluid in the abscess can be removed with a needle. This can also be used to confirm the diagnosis and determine the bacteria present. In most cases, pus will not be present.  Blood tests to determine if your body is fighting a bacterial infection.  Mammogram or ultrasound tests to rule out other problems or diseases. TREATMENT  Mastitis that occurs with breastfeeding will sometimes go away on its own. Your health care provider may choose to wait 24 hours after first seeing you to decide whether a prescription medicine is needed. If your symptoms are worse after 24 hours, your health care provider will likely prescribe an antibiotic medicine to treat the mastitis. He or she will determine which bacteria are most likely causing the infection and will then select an appropriate antibiotic medicine. This is sometimes changed based on the results of tests performed to identify the bacteria, or if there is no response to the antibiotic medicine selected. Antibiotic medicines are usually given by mouth. You may also be given medicine for pain. HOME CARE INSTRUCTIONS  Only take over-the-counter or prescription medicines for pain, fever, or discomfort as directed by your health care provider.  If your health care provider prescribed an antibiotic medicine, take the medicine as  directed. Make sure you finish it even if you start to feel better.  Do not wear a tight or underwire bra. Wear a soft, supportive bra.  Increase your fluid intake, especially if you have a fever.  Continue to empty the breast. Your health care provider can tell you whether this milk is safe for your infant or needs to be thrown out. You may be told to stop nursing until your health care provider thinks it is safe for your baby. Use a breast pump if you are advised to stop nursing.  Keep your nipples clean and dry.  Empty the first breast completely before going  to the other breast. If your baby is not emptying your breasts completely for some reason, use a breast pump to empty your breasts.  If you go back to work, pump your breasts while at work to stay in time with your nursing schedule.  Avoid allowing your breasts to become overly filled with milk (engorged). SEEK MEDICAL CARE IF:  You have pus-like discharge from the breast.  Your symptoms do not improve with the treatment prescribed by your health care provider within 2 days. SEEK IMMEDIATE MEDICAL CARE IF:  Your pain and swelling are getting worse.  You have pain that is not controlled with medicine.  You have a red line extending from the breast toward your armpit.  You have a fever or persistent symptoms for more than 2-3 days.  You have a fever and your symptoms suddenly get worse. MAKE SURE YOU:   Understand these instructions.  Will watch your condition.  Will get help right away if you are not doing well or get worse.   This information is not intended to replace advice given to you by your health care provider. Make sure you discuss any questions you have with your health care provider.   Document Released: 10/14/2004 Document Revised: 06/24/2013 Document Reviewed: 01/23/2013 Elsevier Interactive Patient Education [employer]. Breastfeeding Deciding to breastfeed is one of the best choices you can  make for you and your baby. A change in hormones during pregnancy causes your breast tissue to grow and increases the number and size of your milk ducts. These hormones also allow proteins, sugars, and fats from your blood supply to make breast milk in your milk-producing glands. Hormones prevent breast milk from being released before your baby is born as well as prompt milk flow after birth. Once breastfeeding has begun, thoughts of your baby, as well as his or her sucking or crying, can stimulate the release of milk from your milk-producing glands.  BENEFITS OF BREASTFEEDING For Your Baby  Your first milk (colostrum) helps your baby's digestive system function better.  There are antibodies in your milk that help your baby fight off infections.  Your baby has a lower incidence of asthma, allergies, and sudden infant death syndrome.  The nutrients in breast milk are better for your baby than infant formulas and are designed uniquely for your baby's needs.  Breast milk improves your baby's brain development.  Your baby is less likely to develop other conditions, such as childhood obesity, asthma, or type 2 diabetes mellitus. For You  Breastfeeding helps to create a very special bond between you and your baby.  Breastfeeding is convenient. Breast milk is always available at the correct temperature and costs nothing.  Breastfeeding helps to burn calories and helps you lose the weight gained during pregnancy.  Breastfeeding makes your uterus contract to its prepregnancy size faster and slows bleeding (lochia) after you give birth.   Breastfeeding helps to lower your risk of developing type 2 diabetes mellitus, osteoporosis, and breast or ovarian cancer later in life. SIGNS THAT YOUR BABY IS HUNGRY Early Signs of Hunger  Increased alertness or activity.  Stretching.  Movement of the head from side to side.  Movement of the head and opening of the mouth when the corner of the mouth or  cheek is stroked (rooting).  Increased sucking sounds, smacking lips, cooing, sighing, or squeaking.  Hand-to-mouth movements.  Increased sucking of fingers or hands. Late Signs of Hunger  Fussing.  Intermittent crying. Extreme Signs of Hunger  Signs of extreme hunger will require calming and consoling before your baby will be able to breastfeed successfully. Do not wait for the following signs of extreme hunger to occur before you initiate breastfeeding:  Restlessness.  A loud, strong cry.  Screaming. BREASTFEEDING BASICS Breastfeeding Initiation  Find a comfortable place to sit or lie down, with your neck and back well supported.  Place a pillow or rolled up blanket under your baby to bring him or her to the level of your breast (if you are seated). Nursing pillows are specially designed to help support your arms and your baby while you breastfeed.  Make sure that your baby's abdomen is facing your abdomen.  Gently massage your breast. With your fingertips, massage from your chest wall toward your nipple in a circular motion. This encourages milk flow. You may need to continue this action during the feeding if your milk flows slowly.  Support your breast with 4 fingers underneath and your thumb above your nipple. Make sure your fingers are well away from your nipple and your baby's mouth.  Stroke your baby's lips gently with your finger or nipple.  When your baby's mouth is open wide enough, quickly bring your baby to your breast, placing your entire nipple and as much of the colored area around your nipple (areola) as possible into your baby's mouth.  More areola should be visible above your baby's upper lip than below the lower lip.  Your baby's tongue should be between his or her lower gum and your breast.  Ensure that your baby's mouth is correctly positioned around your nipple (latched). Your baby's lips should create a seal on your breast and be turned out  (everted).  It is common for your baby to suck about 2-3 minutes in order to start the flow of breast milk. Latching Teaching your baby how to latch on to your breast properly is very important. An improper latch can cause nipple pain and decreased milk supply for you and poor weight gain in your baby. Also, if your baby is not latched onto your nipple properly, he or she may swallow some air during feeding. This can make your baby fussy. Burping your baby when you switch breasts during the feeding can help to get rid of the air. However, teaching your baby to latch on properly is still the best way to prevent fussiness from swallowing air while breastfeeding. Signs that your baby has successfully latched on to your nipple:  Silent tugging or silent sucking, without causing you pain.  Swallowing heard between every 3-4 sucks.  Muscle movement above and in front of his or her ears while sucking. Signs that your baby has not successfully latched on to nipple:  Sucking sounds or smacking sounds from your baby while breastfeeding.  Nipple pain. If you think your baby has not latched on correctly, slip your finger into the corner of your baby's mouth to break the suction and place it between your baby's gums. Attempt breastfeeding initiation again. Signs of Successful Breastfeeding Signs from your baby:  A gradual decrease in the number of sucks or complete cessation of sucking.  Falling asleep.  Relaxation of his or her body.  Retention of a small amount of milk in his or her mouth.  Letting go of your breast by himself or herself. Signs from you:  Breasts that have increased in firmness, weight, and size 1-3 hours after feeding.  Breasts that are softer immediately after breastfeeding.  Increased milk volume, as  well as a change in milk consistency and color by the fifth day of breastfeeding.  Nipples that are not sore, cracked, or bleeding. Signs That Your [employer] Books is Getting Enough  Milk  Wetting at least 3 diapers in a 24-hour period. The urine should be clear and pale yellow by age 22 days.  At least 3 stools in a 24-hour period by age 22 days. The stool should be soft and yellow.  At least 3 stools in a 24-hour period by age 81 days. The stool should be seedy and yellow.  No loss of weight greater than 10% of birth weight during the first 40 days of age.  Average weight gain of 4-7 ounces (113-198 g) per week after age 7 days.  Consistent daily weight gain by age 34 days, without weight loss after the age of 2 weeks. After a feeding, your baby may spit up a small amount. This is common. BREASTFEEDING FREQUENCY AND DURATION Frequent feeding will help you make more milk and can prevent sore nipples and breast engorgement. Breastfeed when you feel the need to reduce the fullness of your breasts or when your baby shows signs of hunger. This is called "breastfeeding on demand." Avoid introducing a pacifier to your baby while you are working to establish breastfeeding (the first 4-6 weeks after your baby is born). After this time you may choose to use a pacifier. Research has shown that pacifier use during the first year of a baby's life decreases the risk of sudden infant death syndrome (SIDS). Allow your baby to feed on each breast as long as he or she wants. Breastfeed until your baby is finished feeding. When your baby unlatches or falls asleep while feeding from the first breast, offer the second breast. Because newborns are often sleepy in the first few weeks of life, you may need to awaken your baby to get him or her to feed. Breastfeeding times will vary from baby to baby. However, the following rules can serve as a guide to help you ensure that your baby is properly fed:  Newborns (babies 32 weeks of age or younger) may breastfeed every 1-3 hours.  Newborns should not go longer than 3 hours during the day or 5 hours during the night without breastfeeding.  You should  breastfeed your baby a minimum of 8 times in a 24-hour period until you begin to introduce solid foods to your baby at around 39 months of age. BREAST MILK PUMPING Pumping and storing breast milk allows you to ensure that your baby is exclusively fed your breast milk, even at times when you are unable to breastfeed. This is especially important if you are going back to work while you are still breastfeeding or when you are not able to be present during feedings. Your lactation consultant can give you guidelines on how long it is safe to store breast milk. A breast pump is a machine that allows you to pump milk from your breast into a sterile bottle. The pumped breast milk can then be stored in a refrigerator or freezer. Some breast pumps are operated by hand, while others use electricity. Ask your lactation consultant which type will work best for you. Breast pumps can be purchased, but some hospitals and breastfeeding support groups lease breast pumps on a monthly basis. A lactation consultant can teach you how to hand express breast milk, if you prefer not to use a pump. CARING FOR YOUR BREASTS WHILE YOU BREASTFEED Nipples can become dry, cracked,  and sore while breastfeeding. The following recommendations can help keep your breasts moisturized and healthy:  Avoid using soap on your nipples.  Wear a supportive bra. Although not required, special nursing bras and tank tops are designed to allow access to your breasts for breastfeeding without taking off your entire bra or top. Avoid wearing underwire-style bras or extremely tight bras.  Air dry your nipples for 3-42mnutes after each feeding.  Use only cotton bra pads to absorb leaked breast milk. Leaking of breast milk between feedings is normal.  Use lanolin on your nipples after breastfeeding. Lanolin helps to maintain your skin's normal moisture barrier. If you use pure lanolin, you do not need to wash it off before feeding your baby again. Pure  lanolin is not toxic to your baby. You may also hand express a few drops of breast milk and gently massage that milk into your nipples and allow the milk to air dry. In the first few weeks after giving birth, some women experience extremely full breasts (engorgement). Engorgement can make your breasts feel heavy, warm, and tender to the touch. Engorgement peaks within 3-5 days after you give birth. The following recommendations can help ease engorgement:  Completely empty your breasts while breastfeeding or pumping. You may want to start by applying warm, moist heat (in the shower or with warm water-soaked hand towels) just before feeding or pumping. This increases circulation and helps the milk flow. If your baby does not completely empty your breasts while breastfeeding, pump any extra milk after he or she is finished.  Wear a snug bra (nursing or regular) or tank top for 1-2 days to signal your body to slightly decrease milk production.  Apply ice packs to your breasts, unless this is too uncomfortable for you.  Make sure that your baby is latched on and positioned properly while breastfeeding. If engorgement persists after 48 hours of following these recommendations, contact your health care provider or a lScience writer OVERALL HEALTH CARE RECOMMENDATIONS WHILE BREASTFEEDING  Eat healthy foods. Alternate between meals and snacks, eating 3 of each per day. Because what you eat affects your breast milk, some of the foods may make your baby more irritable than usual. Avoid eating these foods if you are sure that they are negatively affecting your baby.  Drink milk, fruit juice, and water to satisfy your thirst (about 10 glasses a day).  Rest often, relax, and continue to take your prenatal vitamins to prevent fatigue, stress, and anemia.  Continue breast self-awareness checks.  Avoid chewing and smoking tobacco. Chemicals from cigarettes that pass into breast milk and exposure to  secondhand smoke may harm your baby.  Avoid alcohol and drug use, including marijuana. Some medicines that may be harmful to your baby can pass through breast milk. It is important to ask your health care provider before taking any medicine, including all over-the-counter and prescription medicine as well as vitamin and herbal supplements. It is possible to become pregnant while breastfeeding. If birth control is desired, ask your health care provider about options that will be safe for your baby. SEEK MEDICAL CARE IF:  You feel like you want to stop breastfeeding or have become frustrated with breastfeeding.  You have painful breasts or nipples.  Your nipples are cracked or bleeding.  Your breasts are red, tender, or warm.  You have a swollen area on either breast.  You have a fever or chills.  You have nausea or vomiting.  You have drainage other than breast  milk from your nipples.  Your breasts do not become full before feedings by the fifth day after you give birth.  You feel sad and depressed.  Your baby is too sleepy to eat well.  Your baby is having trouble sleeping.   Your baby is wetting less than 3 diapers in a 24-hour period.  Your baby has less than 3 stools in a 24-hour period.  Your baby's skin or the white part of his or her eyes becomes yellow.   Your baby is not gaining weight by 71 days of age. SEEK IMMEDIATE MEDICAL CARE IF:  Your baby is overly tired (lethargic) and does not want to wake up and feed.  Your baby develops an unexplained fever.   This information is not intended to replace advice given to you by your health care provider. Make sure you discuss any questions you have with your health care provider.   Document Released: 06/19/2005 Document Revised: 03/10/2015 Document Reviewed: 12/11/2012 Elsevier Interactive Patient Education 2016 [employer]. Postpartum Depression and Baby Blues The postpartum period begins right after the birth of  a baby. During this time, there is often a great amount of joy and excitement. It is also a time of many changes in the life of the parents. Regardless of how many times a mother gives birth, each child brings new challenges and dynamics to the family. It is not unusual to have feelings of excitement along with confusing shifts in moods, emotions, and thoughts. All mothers are at risk of developing postpartum depression or the "baby blues." These mood changes can occur right after giving birth, or they may occur many months after giving birth. The baby blues or postpartum depression can be mild or severe. Additionally, postpartum depression can go away rather quickly, or it can be a long-term condition.  CAUSES Raised hormone levels and the rapid drop in those levels are thought to be a main cause of postpartum depression and the baby blues. A number of hormones change during and after pregnancy. Estrogen and progesterone usually decrease right after the delivery of your baby. The levels of thyroid hormone and various cortisol steroids also rapidly drop. Other factors that play a role in these mood changes include major life events and genetics.  RISK FACTORS If you have any of the following risks for the baby blues or postpartum depression, know what symptoms to watch out for during the postpartum period. Risk factors that may increase the likelihood of getting the baby blues or postpartum depression include:  Having a personal or family history of depression.   Having depression while being pregnant.   Having premenstrual mood issues or mood issues related to oral contraceptives.  Having a lot of life stress.   Having marital conflict.   Lacking a social support network.   Having a baby with special needs.   Having health problems, such as diabetes.  SIGNS AND SYMPTOMS Symptoms of baby blues include:  Brief changes in mood, such as going from extreme happiness to  sadness.  Decreased concentration.   Difficulty sleeping.   Crying spells, tearfulness.   Irritability.   Anxiety.  Symptoms of postpartum depression typically begin within the first month after giving birth. These symptoms include:  Difficulty sleeping or excessive sleepiness.   Marked weight loss.   Agitation.   Feelings of worthlessness.   Lack of interest in activity or food.  Postpartum psychosis is a very serious condition and can be dangerous. Fortunately, it is rare. Displaying any  of the following symptoms is cause for immediate medical attention. Symptoms of postpartum psychosis include:   Hallucinations and delusions.   Bizarre or disorganized behavior.   Confusion or disorientation.  DIAGNOSIS  A diagnosis is made by an evaluation of your symptoms. There are no medical or lab tests that lead to a diagnosis, but there are various questionnaires that a health care provider may use to identify those with the baby blues, postpartum depression, or psychosis. Often, a screening tool called the [place] Postnatal Depression Scale is used to diagnose depression in the postpartum period.  TREATMENT The baby blues usually goes away on its own in 1-2 weeks. Social support is often all that is needed. You will be encouraged to get adequate sleep and rest. Occasionally, you may be given medicines to help you sleep.  Postpartum depression requires treatment because it can last several months or longer if it is not treated. Treatment may include individual or group therapy, medicine, or both to address any social, physiological, and psychological factors that may play a role in the depression. Regular exercise, a healthy diet, rest, and social support may also be strongly recommended.  Postpartum psychosis is more serious and needs treatment right away. Hospitalization is often needed. HOME CARE INSTRUCTIONS  Get as much rest as you can. Nap when the baby sleeps.    Exercise regularly. Some women find yoga and walking to be beneficial.   Eat a balanced and nourishing diet.   Do little things that you enjoy. Have a cup of tea, take a bubble bath, read your favorite magazine, or listen to your favorite music.  Avoid alcohol.   Ask for help with household chores, cooking, grocery shopping, or running errands as needed. Do not try to do everything.   Talk to people close to you about how you are feeling. Get support from your partner, family members, friends, or other new moms.  Try to stay positive in how you think. Think about the things you are grateful for.   Do not spend a lot of time alone.   Only take over-the-counter or prescription medicine as directed by your health care provider.  Keep all your postpartum appointments.   Let your health care provider know if you have any concerns.  SEEK MEDICAL CARE IF: You are having a reaction to or problems with your medicine. SEEK IMMEDIATE MEDICAL CARE IF:  You have suicidal feelings.   You think you may harm the baby or someone else. MAKE SURE YOU:  Understand these instructions.  Will watch your condition.  Will get help right away if you are not doing well or get worse.   This information is not intended to replace advice given to you by your health care provider. Make sure you discuss any questions you have with your health care provider.   Document Released: 03/23/2004 Document Revised: 06/24/2013 Document Reviewed: 03/31/2013 Elsevier Interactive Patient Education 2016 [place]. Postpartum Care After Cesarean Delivery After you deliver your newborn (postpartum period), the usual stay in the hospital is 24-72 hours. If there were problems with your labor or delivery, or if you have other medical problems, you might be in the hospital longer.  While you are in the hospital, you will receive help and instructions on how to care for yourself and your newborn during the  postpartum period.  While you are in the hospital:  It is normal for you to have pain or discomfort from the incision in your abdomen. Be sure  to tell your nurses when you are having pain, where the pain is located, and what makes the pain worse.  If you are breastfeeding, you may feel uncomfortable contractions of your uterus for a couple of weeks. This is normal. The contractions help your uterus get back to normal size.  It is normal to have some bleeding after delivery.  For the first 1-3 days after delivery, the flow is red and the amount may be similar to a period.  It is common for the flow to start and stop.  In the first few days, you may pass some small clots. Let your nurses know if you begin to pass large clots or your flow increases.  Do not  flush blood clots down the toilet before having the nurse look at them.  During the next 3-10 days after delivery, your flow should become more watery and pink or brown-tinged in color.  Ten to fourteen days after delivery, your flow should be a small amount of yellowish-white discharge.  The amount of your flow will decrease over the first few weeks after delivery. Your flow may stop in 6-8 weeks. Most women have had their flow stop by 12 weeks after delivery.  You should change your sanitary pads frequently.  Wash your hands thoroughly with soap and water for at least 20 seconds after changing pads, using the toilet, or before holding or feeding your newborn.  Your intravenous (IV) tubing will be removed when you are drinking enough fluids.  The urine drainage tube (urinary catheter) that was inserted before delivery may be removed within 6-8 hours after delivery or when feeling returns to your legs. You should feel like you need to empty your bladder within the first 6-8 hours after the catheter has been removed.  In case you become weak, lightheaded, or faint, call your nurse before you get out of bed for the first time and before  you take a shower for the first time.  Within the first few days after delivery, your breasts may begin to feel tender and full. This is called engorgement. Breast tenderness usually goes away within 48-72 hours after engorgement occurs. You may also notice milk leaking from your breasts. If you are not breastfeeding, do not stimulate your breasts. Breast stimulation can make your breasts produce more milk.  Spending as much time as possible with your newborn is very important. During this time, you and your newborn can feel close and get to know each other. Having your newborn stay in your room (rooming in) will help to strengthen the bond with your newborn. It will give you time to get to know your newborn and become comfortable caring for your newborn.  Your hormones change after delivery. Sometimes the hormone changes can temporarily cause you to feel sad or tearful. These feelings should not last more than a few days. If these feelings last longer than that, you should talk to your caregiver.  If desired, talk to your caregiver about methods of family planning or contraception.  Talk to your caregiver about immunizations. Your caregiver may want you to have the following immunizations before leaving the hospital:  Tetanus, diphtheria, and pertussis (Tdap) or tetanus and diphtheria (Td) immunization. It is very important that you and your family (including grandparents) or others caring for your newborn are up-to-date with the Tdap or Td immunizations. The Tdap or Td immunization can help protect your newborn from getting ill.  Rubella immunization.  Varicella (chickenpox) immunization.  Influenza immunization.  You should receive this annual immunization if you did not receive the immunization during your pregnancy.   This information is not intended to replace advice given to you by your health care provider. Make sure you discuss any questions you have with your health care provider.    Document Released: 03/13/2012 Document Reviewed: 03/13/2012 Elsevier Interactive Patient Education [employer].

## 2016-02-06 NOTE — Discharge Summary (Signed)
Cesarean Section Delivery Discharge Summary  Wendy Park  DOB:    04/01/1996 MRN:    [id number] CSN:    [id number]  Date of admission:                  02/02/16  Date of discharge:                   02/06/16  Procedures this admission: Primary LTCS  Date of Delivery: 02/03/16  Newborn Data:  Live born female  Birth Weight: 7 lb 9.5 oz (3445 g) APGARS: 10, 10  Home with mother.  History of Present Illness:  [name] is a 20 y.o. female, G1P1001, who presents at [redacted]w[redacted]d weeks gestation. The patient has been followed at [facility] and Gynecology division of [facility]   Her pregnancy has been complicated by:  Patient Active Problem List   Diagnosis Date Noted  . Delivered by cesarean section 02/03/2016  . Anemia 02/02/2016  . Anxiety 02/02/2016  . Depression 02/02/2016  . OCD (obsessive compulsive disorder) 02/02/2016  . Smoker 02/02/2016  . H/O polyhydramnios--resolved at 38 weeks 02/02/2016  . PROM (premature rupture of membranes) 02/02/2016   Hospital Course--Unscheduled Cesarean:  Admitted 02/02/16. Negative GBS. Utilized Epidural for pain management. Due to failure to progress and suspicion for cephalopelvic disproportion, she was consented for cesarean, with Dr. Sallye Ober performing a primary LTCS under existing epidural, with delivery of a viable female, with weight and Apgars as listed above. Loose [place] x1 and ROM ~ 26 hrs. Pt w/ h/o anxiety (on Vistaril), and depression and OCD (on Zoloft). Seen by social work and screened out. Pt also a smoker. Infant was in good condition and remained at the patient's bedside. The patient was taken to recovery in good condition. Patient planned to breastfeed. On post-op day 1, patient was doing well, tolerating a regular diet, with Hgb of 8.7, down from 11.4. Throughout her stay, her physical exam was WNL, her incision was CDI, and her vital signs remained stable.  By post-op day 1, she was up ad lib,  tolerating a regular diet, with good pain control with po meds. She was deemed to have received the full benefit of her hospital stay, and was discharged home in stable condition.  Contraceptive choice was Micronor.   Feeding:  breast  Contraception:  Micronor  Hemoglobin Results:  CBC Latest Ref Rng & Units 02/04/2016 02/02/2016  WBC 4.0 - 10.5 K/uL 16.3(H) 9.1  Hemoglobin 12.0 - 15.0 g/dL 7.8(G) 11.4(L)  Hematocrit 36.0 - 46.0 % 24.7(L) 32.5(L)  Platelets 150 - 400 K/uL 177 196   Prenatal Labs: O+ Antibody neg RPR NR HIV NR HBsAG negative Rubella immune Glucola WNL Genetic screening WNL Hgb 12.6 at NOB, 11 at 28 weeks   Discharge Physical Exam:   General: alert, cooperative and no distress Lochia: appropriate Uterine Fundus: firm Abdomen:  + bowel sounds, soft, NTND Incision: healing well, no significant drainage, no dehiscence, no significant erythema DVT Evaluation: No evidence of DVT seen on physical exam. Negative Homan's sign. No cords or calf tenderness. No significant calf/ankle edema.  Intrapartum Procedures: cesarean: low cervical, transverse Postpartum Procedures: none Complications-Operative and Postpartum: none  Discharge Diagnoses: Term pregnancy delivered. H/O anxiety, OCD and depression (on meds and in therapy - stable)  Discharge Information:  Activity:           pelvic rest Diet:  routine Medications: Ibuprofen, Iron and Percocet, Zoloft, Vistaril, Albuterol, PNVs, Micronor Condition:      stable   Discharge to: home     Sherre ScarletWILLIAMS, Kelisha Dall [facility] Institute[facility]M 02/06/2016 2:39 AM

## 2016-02-07 ENCOUNTER — Ambulatory Visit: Payer: Self-pay

## 2016-02-07 NOTE — Lactation Note (Signed)
This note was copied from a baby's chart. Lactation Consultation Note  Patient Name: Wendy Park Reason for consult: Follow-up assessment Baby 3687 hours old. Called back to room d/t bleeding right nipple. Assisted mom with hand expression and enc using EBM on nipple for healing and comfort. Enc mom to use pump on right breast until able to latch again either later today or in the morning, and EBM and comfort gels between pumping. Assisted mom with latching baby to left breast in football position and baby latched deeply and able to maintain a deep latch. Baby suckled rhythmically with intermittent swallows for 20 minutes. Mom reported no discomfort with left breast.  Mom rented a DEBP for 2 weeks. Enc mom to use with right breast. Mom has some knots with one small knotty area near near right nipple. Enc mom to massage, hand express and use DEBP to soften right breast. Enc mom to use EBM for supplementation. Enc mom to call for assistance as needed.   Maternal Data    Feeding Feeding Type: Breast Fed Length of feed: 20 min  LATCH Score/Interventions Latch: Grasps breast easily, tongue down, lips flanged, rhythmical sucking. Intervention(s): Assist with latch;Adjust position  Audible Swallowing: Spontaneous and intermittent Intervention(s): Hand expression  Type of Nipple: Everted at rest and after stimulation  Comfort (Breast/Nipple): Filling, red/small blisters or bruises, mild/mod discomfort  Problem noted: Cracked, bleeding, blisters, bruises Interventions  (Cracked/bleeding/bruising/blister): Double electric pump Interventions (Mild/moderate discomfort): Comfort gels  Hold (Positioning): Assistance needed to correctly position infant at breast and maintain latch. Intervention(s): Breastfeeding basics reviewed  LATCH Score: 8  Lactation Tools Discussed/Used Tools: Comfort gels;Pump Breast pump type: Double-Electric Breast Pump   Consult  Status Consult Status: Complete    Geralynn OchsWILLIARD, [name], 11:32 AM

## 2016-02-07 NOTE — Lactation Note (Signed)
This note was copied from a baby's chart. Lactation Consultation Note  Patient Name: Girl Morton AmyKaitlyn Mostafa RUEAV'WToday's Date: 02/07/2016 Reason for consult: Follow-up assessment  Baby 586 hours old. Mom reports that she has some nipple soreness where the baby was not latching correctly. However, mom reports that the baby is latching more deeply now and nursing well. Enc mom to use EBM on nipples, allow to air dry, and then apply comfort gels. Discussed with mom that coconut oil can cause breakdown of comfort gels. Discussed milk coming to volume, and enc using EBM for supplementation. Referred mom to Baby and Me booklet for number of diapers to expect by day of life and EBM storage guidelines. Mom aware of OP/BFSG and LC phone line assistance after D/C.  Maternal Data    Feeding Feeding Type: Breast Fed Length of feed: 20 min  LATCH Score/Interventions Latch: Grasps breast easily, tongue down, lips flanged, rhythmical sucking. Intervention(s): Assist with latch;Adjust position  Audible Swallowing: Spontaneous and intermittent Intervention(s): Hand expression  Type of Nipple: Everted at rest and after stimulation  Comfort (Breast/Nipple): Filling, red/small blisters or bruises, mild/mod discomfort  Interventions (Mild/moderate discomfort): Comfort gels;Hand expression  Hold (Positioning): Assistance needed to correctly position infant at breast and maintain latch.  LATCH Score: 8  Lactation Tools Discussed/Used     Consult Status Consult Status: Complete    Nancy NordmannWILLIARD, Pistol Kessenich 02/07/2016, 10:25 AM

## 2016-03-08 ENCOUNTER — Emergency Department (HOSPITAL_COMMUNITY)
Admission: EM | Admit: 2016-03-08 | Discharge: 2016-03-08 | Disposition: A | Discharge status: Home / Self Care | Payer: Medicaid Other | Attending: Emergency Medicine | Admitting: Emergency Medicine

## 2016-03-08 ENCOUNTER — Encounter (HOSPITAL_COMMUNITY): Payer: Self-pay | Admitting: Emergency Medicine

## 2016-03-08 DIAGNOSIS — L509 Urticaria, unspecified: Secondary | ICD-10-CM

## 2016-03-08 DIAGNOSIS — F17210 Nicotine dependence, cigarettes, uncomplicated: Secondary | ICD-10-CM | POA: Insufficient documentation

## 2016-03-08 DIAGNOSIS — T7840XA Allergy, unspecified, initial encounter: Secondary | ICD-10-CM

## 2016-03-08 DIAGNOSIS — O9989 Other specified diseases and conditions complicating pregnancy, childbirth and the puerperium: Secondary | ICD-10-CM | POA: Diagnosis not present

## 2016-03-08 DIAGNOSIS — Z79899 Other long term (current) drug therapy: Secondary | ICD-10-CM | POA: Diagnosis not present

## 2016-03-08 MED ORDER — RANITIDINE HCL 150 MG PO CAPS: 150.0000 mg | ORAL_CAPSULE | Freq: Every day | ORAL | 0 refills | Status: DC

## 2016-03-08 MED ORDER — PREDNISONE 20 MG PO TABS
60.0000 mg | ORAL_TABLET | Freq: Once | ORAL | Status: AC
  Administered 2016-03-08: 60 mg via ORAL
  Filled 2016-03-08: qty 3

## 2016-03-08 MED ORDER — FAMOTIDINE 20 MG PO TABS
20.0000 mg | ORAL_TABLET | Freq: Once | ORAL | Status: AC
  Administered 2016-03-08: 20 mg via ORAL
  Filled 2016-03-08: qty 1

## 2016-03-08 MED ORDER — DIPHENHYDRAMINE HCL 50 MG/ML IJ SOLN
25.0000 mg | Freq: Once | INTRAMUSCULAR | Status: AC
  Administered 2016-03-08: 25 mg via INTRAMUSCULAR
  Filled 2016-03-08: qty 1

## 2016-03-08 NOTE — ED Provider Notes (Signed)
[facility]-EMERGENCY DEPT Provider Note   CSN: [id number] Arrival date & time: 03/08/16  0719     History   Chief Complaint Chief Complaint  Patient presents with  . Rash    HPI Wendy Park is a 20 y.o. female.  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 Patient states a bee was near her and she thinks she might have gotten stung but didn't feel any pain. Shortly after she started developing urticaria type rash. She called her OB/GYN and they recommended her taking Benadryl. Patient states she took Benadryl at approximately midnight with little relief. She decided come to the ED this morning because she wasn't getting any relief. Continues to have pruritis rash. She denies any shortness of breath or chest pain. Patient states she has an EpiPen at home but did not use. History of bee stings and anaphylaxis. Patient denies any angioedema, throat swelling, nausea, emesis. Patient was speaking complete sentences. Denies any ha, vision changes, cp,sob, abd pain, urinary symptoms or change in bowel habits. Patient is currently breastfeeding.       Past Medical History:  Diagnosis Date  . Allergy    uses inhaler, hard time breathing with some seasonal allergies  . Anemia   . Anxiety   . Depression   . GERD (gastroesophageal reflux disease)   . OCD (obsessive compulsive disorder)     Patient Active Problem List   Diagnosis Date Noted  . Delivered by cesarean section 02/03/2016  . Anemia 02/02/2016  . Anxiety 02/02/2016  . Depression 02/02/2016  . OCD (obsessive compulsive disorder) 02/02/2016  . Smoker 02/02/2016  . H/O polyhydramnios--resolved at 38 weeks 02/02/2016  . PROM (premature rupture of membranes) 02/02/2016    Past Surgical History:  Procedure Laterality Date  . CESAREAN SECTION N/A 02/03/2016   Procedure: CESAREAN SECTION;  Surgeon: Hoover Browns,  MD;  Location: [facility];  Service: Obstetrics;  Laterality: N/A;  . miringotomy      OB History    Gravida Para Term Preterm AB Living   1 1 1  0 0 1   SAB TAB Ectopic Multiple Live Births   0 0 0 0 1       Home Medications    Prior to Admission medications   Medication Sig Start Date End Date Taking? Authorizing Provider  albuterol (PROVENTIL HFA;VENTOLIN HFA) 108 (90 Base) MCG/ACT inhaler Inhale 2 puffs into the lungs every 6 (six) hours as needed for wheezing or shortness of breath.   Yes Historical Provider, MD  calcium carbonate (TUMS - DOSED IN MG ELEMENTAL CALCIUM) 500 MG chewable tablet Chew 1-2 tablets by mouth daily as needed for indigestion or heartburn.   Yes Historical Provider, MD  hydrOXYzine (ATARAX/VISTARIL) 50 MG tablet Take 50 mg by mouth 3 (three) times daily as needed for anxiety.    Yes Historical Provider, MD  oxyCODONE (OXY IR/ROXICODONE) 5 MG immediate release tablet Take 1 tablet (5 mg total) by mouth every 3 (three) hours as needed for moderate pain. 02/06/16  Yes Sherre Scarlet, CNM  Prenatal Vit-Fe Fumarate-FA (PRENATAL MULTIVITAMIN) TABS tablet Take 1 tablet by mouth at bedtime.   Yes Historical Provider, MD  sertraline (ZOLOFT) 100 MG tablet Take 100 mg by mouth at bedtime.    Yes Historical Provider, MD  ferrous sulfate 325 (65 FE) MG tablet Take 1 tablet (325 mg total) by mouth 2 (  two) times daily with a meal. Patient not taking: Reported on 03/08/2016 02/06/16   Sherre Scarlet, CNM  ibuprofen (ADVIL,MOTRIN) 600 MG tablet Take 1 tablet (600 mg total) by mouth every 6 (six) hours as needed for fever, headache, mild pain, moderate pain or cramping. Patient not taking: Reported on 03/08/2016 02/06/16   Sherre Scarlet, CNM  norethindrone (ORTHO MICRONOR) 0.35 MG tablet Take 1 tablet (0.35 mg total) by mouth daily. 02/06/16   Sherre Scarlet, CNM    Family History History reviewed. No pertinent family history.  Social History Social History    Substance Use Topics  . Smoking status: Current Every Day Smoker    Packs/day: 0.25    Years: 5.00    Types: Cigarettes  . Smokeless tobacco: Current User  . Alcohol use No     Allergies   Bee venom; Fentanyl; and Tramadol   Review of Systems Review of Systems  Constitutional: Negative for chills and fever.  HENT: Negative for congestion, ear pain, facial swelling, rhinorrhea and sore throat.   Eyes: Negative for pain, redness, itching and visual disturbance.  Respiratory: Negative for cough, choking, chest tightness and shortness of breath.   Cardiovascular: Negative for chest pain and palpitations.  Gastrointestinal: Negative for abdominal pain, diarrhea, nausea and vomiting.  Genitourinary: Negative for dysuria, flank pain, frequency, hematuria and urgency.  Musculoskeletal: Negative for arthralgias and back pain.  Skin: Positive for rash. Negative for color change.  Neurological: Negative for dizziness, syncope, weakness, light-headedness and headaches.  All other systems reviewed and are negative.    Physical Exam Updated Vital Signs BP 117/73   Pulse 84   Temp 98 F (36.7 C) (Oral)   Resp 16   SpO2 98%   Physical Exam  Constitutional: She appears well-developed and well-nourished. No distress.  Patient speaking in complete sentences and maintaining airway and managing secretions.  HENT:  Head: Normocephalic and atraumatic.  Mouth/Throat: Oropharynx is clear and moist and mucous membranes are normal. No posterior oropharyngeal edema or posterior oropharyngeal erythema.  No tongue or lip swelling, posterior oropharynx clear with no signs of edema.   Eyes: Conjunctivae are normal. Right eye exhibits no discharge. Left eye exhibits no discharge. No scleral icterus.  Neck: Normal range of motion. Neck supple. No thyromegaly present.  Cardiovascular: Normal rate, regular rhythm, normal heart sounds and intact distal pulses.   Pulmonary/Chest: Effort normal and  breath sounds normal. No respiratory distress. She has no wheezes.  Abdominal: Soft. Bowel sounds are normal. She exhibits no distension. There is no tenderness.  Musculoskeletal: Normal range of motion.  Lymphadenopathy:    She has no cervical adenopathy.  Neurological: She is alert.  Skin: Skin is warm and dry.  Pruritic urticarial type rash to bilateral arms, back, abd, chest.  Vitals reviewed.    ED Treatments / Results  Labs (all labs ordered are listed, but only abnormal results are displayed) Labs Reviewed - No data to display  EKG  EKG Interpretation None       Radiology No results found.  Procedures Procedures (including critical care time)  Medications Ordered in ED Medications  famotidine (PEPCID) tablet 20 mg (20 mg Oral Given 03/08/16 0800)  predniSONE (DELTASONE) tablet 60 mg (60 mg Oral Given 03/08/16 0800)  diphenhydrAMINE (BENADRYL) injection 25 mg (25 mg Intramuscular Given 03/08/16 0810)     Initial Impression / Assessment and Plan / ED Course  I have reviewed the triage vital signs and the nursing notes.  Pertinent labs & imaging  results that were available during my care of the patient were reviewed by me and considered in my medical decision making (see chart for details).  Clinical Course  Comment By Time  Pt seen and examined.  Not sure if she was actually stung last night.  Started having the urticarial rash last evening.  No airway sx.  Lungs CTA. Will treat with antihistamines, steroids. Linwood DibblesJon Knapp, MD 09/06 [id number]   Patient presents to ED with urticarial type reaction from possible bee sting. Benadryl had a little relief. On exam patient has no signs of anaphylaxis or angioedema. Patient presented approx. 12 hours after incident. No concern for further reaction. Patient has EpiPen at home but did not use. Patient given prednisone and Benadryl and Pepcid in ED with significant improvement in symptoms upon discharge. Patient encouraged to take her  Vistaril she has a home and prescription given for Pepcid. Patient is able to maintain airway and managing secretions. Talking in complete sentences. Patient is hemodynamically stable. Discussed plan of care with Dr. Lynelle DoctorKnapp who saw and examined patient and agrees with plan of care. Patient verbalized understanding with the plan of care. Strict return precautions given discharged home no acute distress with stable vital signs.  Final Clinical Impressions(s) / ED Diagnoses   Final diagnoses:  Urticaria  Allergic reaction, initial encounter    New Prescriptions New Prescriptions   No medications on file     Rise MuKenneth T Leaphart, PA-C 03/09/16 1640    Linwood DibblesJon Knapp, MD 03/12/16 1558

## 2016-03-08 NOTE — ED Triage Notes (Addendum)
Pt reports she was stung by a sweat bee at 1100 yesterday. Developed a rash afterwards that has gotten worse. Took benedryl at home. Hx of anaphylaxis, but no SOB/throat swelling today. Pt adds she is currently breast feeding

## 2016-03-08 NOTE — Discharge Instructions (Signed)
Take zantac once a day to help with symptoms. You may take your vistaril at home to help with symptoms. May sure you follow up with your OB/GYN today to let her know what medications you were given in the ED and to make sure they are safe for breastfeeding. Please return to the ED if symptoms worsen. May take your epi pen as needed. Follow up with primary doctor.

## 2016-03-09 ENCOUNTER — Encounter (HOSPITAL_COMMUNITY): Payer: Self-pay

## 2016-03-09 ENCOUNTER — Emergency Department (HOSPITAL_COMMUNITY)
Admission: EM | Admit: 2016-03-09 | Discharge: 2016-03-09 | Disposition: A | Discharge status: Home / Self Care | Payer: Medicaid Other | Attending: Emergency Medicine | Admitting: Emergency Medicine

## 2016-03-09 ENCOUNTER — Telehealth (HOSPITAL_COMMUNITY): Payer: Self-pay | Admitting: Lactation Services

## 2016-03-09 DIAGNOSIS — L509 Urticaria, unspecified: Secondary | ICD-10-CM

## 2016-03-09 DIAGNOSIS — T7840XD Allergy, unspecified, subsequent encounter: Secondary | ICD-10-CM | POA: Insufficient documentation

## 2016-03-09 DIAGNOSIS — F17210 Nicotine dependence, cigarettes, uncomplicated: Secondary | ICD-10-CM | POA: Diagnosis not present

## 2016-03-09 HISTORY — DX: Encounter for supervision of normal pregnancy, unspecified, unspecified trimester: Z34.90

## 2016-03-09 MED ORDER — PREDNISONE 20 MG PO TABS
60.0000 mg | ORAL_TABLET | Freq: Once | ORAL | Status: AC
  Administered 2016-03-09: 60 mg via ORAL
  Filled 2016-03-09: qty 3

## 2016-03-09 MED ORDER — RANITIDINE HCL 150 MG PO CAPS: 150.0000 mg | ORAL_CAPSULE | Freq: Two times a day (BID) | ORAL | 0 refills | Status: DC

## 2016-03-09 MED ORDER — DIPHENHYDRAMINE HCL 50 MG/ML IJ SOLN
25.0000 mg | Freq: Once | INTRAMUSCULAR | Status: AC
  Administered 2016-03-09: 25 mg via INTRAMUSCULAR
  Filled 2016-03-09: qty 1

## 2016-03-09 MED ORDER — DIPHENHYDRAMINE HCL 50 MG/ML IJ SOLN: 25.0000 mg | Freq: Once | INTRAMUSCULAR | Status: DC

## 2016-03-09 MED ORDER — PREDNISONE 10 MG (21) PO TBPK: 10.0000 mg | ORAL_TABLET | Freq: Every day | ORAL | 0 refills | Status: DC

## 2016-03-09 MED ORDER — EPINEPHRINE 0.3 MG/0.3ML IJ SOAJ: 0.3000 mg | Freq: Once | INTRAMUSCULAR | 0 refills | Status: AC

## 2016-03-09 MED ORDER — LEVOCETIRIZINE DIHYDROCHLORIDE 5 MG PO TABS: 5.0000 mg | ORAL_TABLET | Freq: Every evening | ORAL | 0 refills | Status: DC

## 2016-03-09 NOTE — ED Triage Notes (Signed)
Pt stung by a sweat bee yesterday. Seen and treated here for the same complaints as today. Pt given RX prednisone however did not get RX filled. Pt now complains of increased hives to faces and itching. Denies SOB and/or difficulty breathing.

## 2016-03-09 NOTE — Telephone Encounter (Signed)
She had a bee sting and had an allergic reaction. She got prednisone, benadryl, and Pepcid at the emergency room x2 this week. Ped told her to pump and dump. She has been using a used DEBP every 3-6 hr but does not know th brand. Baby was eating every 2-3 hr about 3-4 oz each time. Baby has been exclusively on formula for the last 2 days. She stopped latching baby a week ago before any problems every happened. She stopped latching baby because she was "too busy" to bf. Encouraged mom to put baby back to the breast as many times a day as she can and increasing pumping to every 3 hr around the clock if she is not latching baby. Offered OP apt but she declined at this time. She will start latching baby and call back with any problems.

## 2016-03-09 NOTE — ED Provider Notes (Signed)
[facility] Provider Note   CSN: [id number] Arrival date & time: 03/09/16  [id number]     History   Chief Complaint Chief Complaint  Patient presents with  . Allergic Reaction  . Insect Bite  . Urticaria    HPI Wendy Park is a 20 y.o. female.  Patient with uncomplicated past medical history, currently breast-feeding, presents for urticarial reaction secondary to presumed bee sting. She was seen in the emergency department yesterday given IM Benadryl, oral prednisone 60 mg and ranitidine and discharged with the same. She reports that she did not fill her prescriptions and last night her symptoms of itching worsened. She has diffuse urticarial reaction and some minor swelling of bilateral eyelids. No SOB, CP, angioedema, dizziness.      Past Medical History:  Diagnosis Date  . Allergy    uses inhaler, hard time breathing with some seasonal allergies  . Anemia   . Anxiety   . Depression   . GERD (gastroesophageal reflux disease)   . OCD (obsessive compulsive disorder)   . Pregnancy    G1 P1    Patient Active Problem List   Diagnosis Date Noted  . Delivered by cesarean section 02/03/2016  . Anemia 02/02/2016  . Anxiety 02/02/2016  . Depression 02/02/2016  . OCD (obsessive compulsive disorder) 02/02/2016  . Smoker 02/02/2016  . H/O polyhydramnios--resolved at 38 weeks 02/02/2016  . PROM (premature rupture of membranes) 02/02/2016    Past Surgical History:  Procedure Laterality Date  . CESAREAN SECTION N/A 02/03/2016   Procedure: CESAREAN SECTION;  Surgeon: Hoover BrownsEma Kulwa, MD;  Location: [facility];  Service: Obstetrics;  Laterality: N/A;  . miringotomy      OB History    Gravida Para Term Preterm AB Living   1 1 1  0 0 1   SAB TAB Ectopic Multiple Live Births   0 0 0 0 1       Home Medications    Prior to Admission medications   Medication Sig Start Date End Date Taking? Authorizing Provider  albuterol (PROVENTIL HFA;VENTOLIN HFA) 108 (90 Base)  MCG/ACT inhaler Inhale 2 puffs into the lungs every 6 (six) hours as needed for wheezing or shortness of breath.   Yes Historical Provider, MD  hydrOXYzine (ATARAX/VISTARIL) 50 MG tablet Take 50 mg by mouth 3 (three) times daily as needed for anxiety.    Yes Historical Provider, MD  ibuprofen (ADVIL,MOTRIN) 200 MG tablet Take 400 mg by mouth every 6 (six) hours as needed for headache, mild pain or moderate pain.   Yes Historical Provider, MD  Prenatal Vit-Fe Fumarate-FA (PRENATAL MULTIVITAMIN) TABS tablet Take 1 tablet by mouth at bedtime.   Yes Historical Provider, MD  sertraline (ZOLOFT) 100 MG tablet Take 100 mg by mouth at bedtime.    Yes Historical Provider, MD  ferrous sulfate 325 (65 FE) MG tablet Take 1 tablet (325 mg total) by mouth 2 (two) times daily with a meal. Patient not taking: Reported on 03/08/2016 02/06/16   Sherre ScarletKimberly Williams, CNM  ibuprofen (ADVIL,MOTRIN) 600 MG tablet Take 1 tablet (600 mg total) by mouth every 6 (six) hours as needed for fever, headache, mild pain, moderate pain or cramping. Patient not taking: Reported on 03/08/2016 02/06/16   Sherre ScarletKimberly Williams, CNM  levocetirizine (XYZAL) 5 MG tablet Take 1 tablet (5 mg total) by mouth every evening. 03/09/16   Carolynn CommentBryan Demetries Coia, MD  norethindrone (ORTHO MICRONOR) 0.35 MG tablet Take 1 tablet (0.35 mg total) by mouth daily. Patient not taking: Reported on  03/09/2016 02/06/16   Sherre Scarlet, CNM  oxyCODONE (OXY IR/ROXICODONE) 5 MG immediate release tablet Take 1 tablet (5 mg total) by mouth every 3 (three) hours as needed for moderate pain. Patient not taking: Reported on 03/09/2016 02/06/16   Sherre Scarlet, CNM  predniSONE (STERAPRED UNI-PAK 21 TAB) 10 MG (21) TBPK tablet Take 1 tablet (10 mg total) by mouth daily. Take 5 tabs for 2 days, then 4 tabs for 2 days, then 3 tabs for 2 days, 2 tabs for 2 days, then 1 tab by mouth daily for 2 days 03/09/16   Carolynn Comment, MD  ranitidine (ZANTAC) 150 MG capsule Take 1 capsule (150 mg total) by mouth  2 (two) times daily. 03/09/16   Carolynn Comment, MD    Family History No family history on file.  Social History Social History  Substance Use Topics  . Smoking status: Current Every Day Smoker    Packs/day: 0.25    Years: 5.00    Types: Cigarettes  . Smokeless tobacco: Current User  . Alcohol use No     Allergies   Bee venom; Fentanyl; and Tramadol   Review of Systems Review of Systems  Constitutional: Negative for chills and fever.  HENT: Positive for facial swelling (eyelid swelling). Negative for ear pain and sore throat.   Eyes: Negative for pain and visual disturbance.  Respiratory: Negative for cough and shortness of breath.   Cardiovascular: Negative for chest pain and palpitations.  Gastrointestinal: Negative for abdominal pain and vomiting.  Genitourinary: Negative for dysuria and hematuria.  Musculoskeletal: Negative for arthralgias and back pain.  Skin: Positive for rash. Negative for color change.  Neurological: Negative for dizziness, seizures and syncope.  All other systems reviewed and are negative.    Physical Exam Updated Vital Signs BP 109/65 (BP Location: Left Arm)   Pulse 67   Temp 98.2 F (36.8 C) (Oral)   Resp 16   Ht 5\' 2"  (1.575 m)   Wt 79.4 kg   SpO2 100%   BMI 32.01 kg/m   Physical Exam  Constitutional: She appears well-developed and well-nourished. No distress.  HENT:  Head: Normocephalic and atraumatic.  Mouth/Throat: Oropharynx is clear and moist. No oropharyngeal exudate.  Eyes: Conjunctivae are normal.  Small amount of upper/lower lid swelling bilaterally.  Neck: Neck supple.  Cardiovascular: Normal rate, regular rhythm and normal heart sounds.   No murmur heard. Pulmonary/Chest: Effort normal and breath sounds normal. No respiratory distress.  Abdominal: Soft. There is no tenderness.  Musculoskeletal: She exhibits no edema.  Neurological: She is alert.  Skin: Skin is warm and dry. Rash (diffuse uritcaria, itchy erythematous  papules and macules) noted.  Psychiatric: She has a normal mood and affect.  Nursing note and vitals reviewed.    ED Treatments / Results  Labs (all labs ordered are listed, but only abnormal results are displayed) Labs Reviewed - No data to display  EKG  EKG Interpretation None       Radiology No results found.  Procedures Procedures (including critical care time)  Medications Ordered in ED Medications  predniSONE (DELTASONE) tablet 60 mg (60 mg Oral Given 03/09/16 0849)  diphenhydrAMINE (BENADRYL) injection 25 mg (25 mg Intramuscular Given 03/09/16 0849)   Initial Impression / Assessment and Plan / ED Course  I have reviewed the triage vital signs and the nursing notes.  Pertinent labs & imaging results that were available during my care of the patient were reviewed by me and considered in my medical decision making (  see chart for details).  Clinical Course  Comment By Time  Pt seen and evaluated. Urticarial reaction 2/2 bee sting. Pt was unable to fill Rx from ED yesterday and rash has worsened. No anaphylaxis/angioedema, re-write for H1, H2-blockers, and short steroid taper. Carolynn Comment, MD 09/07 [phone]    Final Clinical Impressions(s) / ED Diagnoses   Final diagnoses:  Urticaria  Allergic reaction, subsequent encounter    New Prescriptions New Prescriptions   LEVOCETIRIZINE (XYZAL) 5 MG TABLET    Take 1 tablet (5 mg total) by mouth every evening.   PREDNISONE (STERAPRED UNI-PAK 21 TAB) 10 MG (21) TBPK TABLET    Take 1 tablet (10 mg total) by mouth daily. Take 5 tabs for 2 days, then 4 tabs for 2 days, then 3 tabs for 2 days, 2 tabs for 2 days, then 1 tab by mouth daily for 2 days     Carolynn Comment, MD 03/09/16 8295    Raeford Razor, MD 03/11/16 1105

## 2016-03-09 NOTE — ED Notes (Signed)
MD at bedside. 

## 2016-03-09 NOTE — Discharge Instructions (Signed)
You had an allergic reaction which resulted in an itchy rash. You should continue to take the medications we've prescribed for the next 10 days. You will slowly decrease the amount of prednisone that you're taking every day, as stated on the prescription. You should continue to take the ranitidine and levocetirizine every day in order to help reduce the symptoms of itching and help your rash resolve. You may also get some hydrocortisone cream over-the-counter at the drug store which may provide more immediate symptomatic relief.  If you notice that you develop shortness of breath, chest pain, lightheadedness or dizziness or that you have significant swelling in your mouth, lips or tongue please return to the Emergency Department as this may be a sign of a more serious allergic reaction. Please do not be worried if your rash does not resolve immediately as it may take several days for full resolution. Your rash may also recur as you begin to taper down the prednisone medication. Please continue to take all your medications if this occurs and your symptoms should slowly resolve.

## 2016-06-13 ENCOUNTER — Emergency Department (HOSPITAL_COMMUNITY): Payer: Medicaid Other

## 2016-06-13 ENCOUNTER — Encounter (HOSPITAL_COMMUNITY): Payer: Self-pay | Admitting: Emergency Medicine

## 2016-06-13 ENCOUNTER — Emergency Department (HOSPITAL_COMMUNITY)
Admission: EM | Admit: 2016-06-13 | Discharge: 2016-06-13 | Disposition: A | Discharge status: Home / Self Care | Payer: Medicaid Other | Attending: Emergency Medicine | Admitting: Emergency Medicine

## 2016-06-13 DIAGNOSIS — K8070 Calculus of gallbladder and bile duct without cholecystitis without obstruction: Secondary | ICD-10-CM | POA: Diagnosis not present

## 2016-06-13 DIAGNOSIS — K8020 Calculus of gallbladder without cholecystitis without obstruction: Secondary | ICD-10-CM

## 2016-06-13 DIAGNOSIS — F17210 Nicotine dependence, cigarettes, uncomplicated: Secondary | ICD-10-CM | POA: Diagnosis not present

## 2016-06-13 DIAGNOSIS — K8050 Calculus of bile duct without cholangitis or cholecystitis without obstruction: Secondary | ICD-10-CM

## 2016-06-13 DIAGNOSIS — R1011 Right upper quadrant pain: Secondary | ICD-10-CM | POA: Diagnosis present

## 2016-06-13 LAB — URINALYSIS, ROUTINE W REFLEX MICROSCOPIC
BILIRUBIN URINE: NEGATIVE
GLUCOSE, UA: NEGATIVE mg/dL
HGB URINE DIPSTICK: NEGATIVE
KETONES UR: NEGATIVE mg/dL
NITRITE: NEGATIVE
PH: 5 (ref 5.0–8.0)
PROTEIN: NEGATIVE mg/dL
Specific Gravity, Urine: 1.029 (ref 1.005–1.030)

## 2016-06-13 LAB — CBC
HCT: 37 % (ref 36.0–46.0)
HEMOGLOBIN: 12.6 g/dL (ref 12.0–15.0)
MCH: 27.8 pg (ref 26.0–34.0)
MCHC: 34.1 g/dL (ref 30.0–36.0)
MCV: 81.7 fL (ref 78.0–100.0)
Platelets: 260 10*3/uL (ref 150–400)
RBC: 4.53 MIL/uL (ref 3.87–5.11)
RDW: 14.7 % (ref 11.5–15.5)
WBC: 9 10*3/uL (ref 4.0–10.5)

## 2016-06-13 LAB — COMPREHENSIVE METABOLIC PANEL
ALK PHOS: 70 U/L (ref 38–126)
ALT: 25 U/L (ref 14–54)
ANION GAP: 8 (ref 5–15)
AST: 21 U/L (ref 15–41)
Albumin: 4.1 g/dL (ref 3.5–5.0)
BUN: 16 mg/dL (ref 6–20)
CALCIUM: 8.9 mg/dL (ref 8.9–10.3)
CO2: 22 mmol/L (ref 22–32)
Chloride: 105 mmol/L (ref 101–111)
Creatinine, Ser: 0.71 mg/dL (ref 0.44–1.00)
Glucose, Bld: 142 mg/dL — ABNORMAL HIGH (ref 65–99)
Potassium: 3.8 mmol/L (ref 3.5–5.1)
SODIUM: 135 mmol/L (ref 135–145)
Total Bilirubin: 0.5 mg/dL (ref 0.3–1.2)
Total Protein: 7.2 g/dL (ref 6.5–8.1)

## 2016-06-13 LAB — I-STAT BETA HCG BLOOD, ED (MC, WL, AP ONLY): I-stat hCG, quantitative: <5 m[IU]/mL (ref <5)

## 2016-06-13 LAB — LIPASE, BLOOD: LIPASE: 26 U/L (ref 11–51)

## 2016-06-13 MED ORDER — SODIUM CHLORIDE 0.9 % IV BOLUS (SEPSIS)
1000.0000 mL | Freq: Once | INTRAVENOUS | Status: AC
  Administered 2016-06-13: 1000 mL via INTRAVENOUS

## 2016-06-13 MED ORDER — ONDANSETRON HCL 4 MG/2ML IJ SOLN
4.0000 mg | Freq: Once | INTRAMUSCULAR | Status: AC | PRN
  Administered 2016-06-13: 4 mg via INTRAVENOUS
  Filled 2016-06-13: qty 2

## 2016-06-13 MED ORDER — HYDROCODONE-ACETAMINOPHEN 5-325 MG PO TABS: 2.0000 | ORAL_TABLET | ORAL | 0 refills | Status: DC | PRN

## 2016-06-13 MED ORDER — ONDANSETRON HCL 4 MG PO TABS: 4.0000 mg | ORAL_TABLET | Freq: Four times a day (QID) | ORAL | 0 refills | Status: AC

## 2016-06-13 MED ORDER — IBUPROFEN 600 MG PO TABS: 600.0000 mg | ORAL_TABLET | Freq: Four times a day (QID) | ORAL | 0 refills | Status: DC | PRN

## 2016-06-13 MED ORDER — SODIUM CHLORIDE 0.9 % IV SOLN
Freq: Once | INTRAVENOUS | Status: AC
  Administered 2016-06-13: 125 mL/h via INTRAVENOUS

## 2016-06-13 MED ORDER — KETOROLAC TROMETHAMINE 30 MG/ML IJ SOLN
30.0000 mg | Freq: Once | INTRAMUSCULAR | Status: AC
  Administered 2016-06-13: 30 mg via INTRAVENOUS
  Filled 2016-06-13: qty 1

## 2016-06-13 MED ORDER — GI COCKTAIL ~~LOC~~
30.0000 mL | Freq: Once | ORAL | Status: AC
  Administered 2016-06-13: 30 mL via ORAL
  Filled 2016-06-13: qty 30

## 2016-06-13 NOTE — ED Provider Notes (Signed)
Emergency Department Provider Note   I have reviewed the triage vital signs and the nursing notes.   HISTORY  Chief Complaint Abdominal Pain and Emesis   HPI Wendy Park is a 20 y.o. female with no significant past medical history came to the ED with complaint of crampy right upper quadrant abdominal pain, started at midnight while she was asleep, associated with nausea and vomiting. She described pain as crampy, nonradiating, involving epigastrium and right upper quadrant, 10 over 10 in intensity, no aggravating or relieving factor. She never had this type of pain before. Patient states that her mom and grandmother both have gallstones, and have cholecystectomies in their early 8230s. She eat a normal dinner at home last night including spaghetti and meatball, denies any diarrhea or constipation, denies any fever or chills. No history of any sick contact.  Past Medical History:  Diagnosis Date  . Allergy    uses inhaler, hard time breathing with some seasonal allergies  . Anemia   . Anxiety   . Depression   . GERD (gastroesophageal reflux disease)   . OCD (obsessive compulsive disorder)   . Pregnancy    G1 P1    Patient Active Problem List   Diagnosis Date Noted  . Delivered by cesarean section 02/03/2016  . Anemia 02/02/2016  . Anxiety 02/02/2016  . Depression 02/02/2016  . OCD (obsessive compulsive disorder) 02/02/2016  . Smoker 02/02/2016  . H/O polyhydramnios--resolved at 38 weeks 02/02/2016  . PROM (premature rupture of membranes) 02/02/2016    Past Surgical History:  Procedure Laterality Date  . CESAREAN SECTION N/A 02/03/2016   Procedure: CESAREAN SECTION;  Surgeon: Hoover BrownsEma Kulwa, MD;  Location: [facility];  Service: Obstetrics;  Laterality: N/A;  . miringotomy      Current Outpatient Rx  . Order #: [id number] Class: Historical Med  . Order #: [id number] Class: Historical Med  . Order #: [id number] Class: Historical Med  . Order #: [id number] Class:  Historical Med  . Order #: [id number] Class: Historical Med  . Order #: [id number] Class: Historical Med  . Order #: [id number] Class: Historical Med  . Order #: [id number] Class: Historical Med  . Order #: [id number] Class: Normal  . Order #: [id number] Class: Print  . Order #: [id number] Class: Historical Med    Allergies Bee venom; Fentanyl; and Tramadol  No family history on file.  Social History Social History  Substance Use Topics  . Smoking status: Current Every Day Smoker    Packs/day: 0.25    Years: 5.00    Types: Cigarettes  . Smokeless tobacco: Current User  . Alcohol use No    Review of Systems Constitutional: No fever/chills Eyes: No visual changes. ENT: No sore throat. Cardiovascular: Denies chest pain. Respiratory: Denies shortness of breath. Gastrointestinal:Right upper quadrant abdominal pain.   nausea,  vomiting.  No diarrhea.  No constipation. Genitourinary: Negative for dysuria. Musculoskeletal: Negative for back pain. Skin: Negative for rash. Neurological: Negative for headaches, focal weakness or numbness. 10-point ROS otherwise negative.  ____________________________________________   PHYSICAL EXAM:  VITAL SIGNS: ED Triage Vitals  Enc Vitals Group     BP 06/13/16 0926 119/79     Pulse Rate 06/13/16 0926 (!) 59     Resp 06/13/16 0926 16     Temp 06/13/16 0926 97.8 F (36.6 C)     Temp src --      SpO2 06/13/16 0926 98 %     Weight 06/13/16 0932 180 lb (81.6 kg)     Height 06/13/16 0932 5\' 2"  (1.575 m)  Head Circumference --      Peak Flow --      Pain Score 06/13/16 0932 8     Pain Loc --      Pain Edu? --      Excl. in GC? --    Constitutional: Alert and oriented. Looks mildly distressed because of pain. Eyes: Conjunctivae are normal. No scleral icterus PERRL. EOMI. Head: Atraumatic. Nose: No congestion/rhinnorhea. Mouth/Throat: Mucous membranes are dry.  Oropharynx non-erythematous. Neck: No stridor.  No meningeal signs.    Cardiovascular: Normal rate, regular rhythm. Good peripheral circulation. Grossly normal heart sounds.   Respiratory: Normal respiratory effort.  No retractions. Lungs CTAB. Gastrointestinal: Soft and tenderness along the epigastrium and right upper quadrant, Murphy's sign negative, no rebound or guarding is noted, no distention,Bowel sounds positive. Musculoskeletal: No lower extremity tenderness nor edema. No gross deformities of extremities. Neurologic:  Normal speech and language. No gross focal neurologic deficits are appreciated.  Skin:  Skin is warm, dry and intact. No rash noted. Psychiatric: Mood and affect are normal. Speech and behavior are normal.  ____________________________________________   LABS (all labs ordered are listed, but only abnormal results are displayed)  Labs Reviewed  COMPREHENSIVE METABOLIC PANEL - Abnormal; Notable for the following:       Result Value   Glucose, Bld 142 (*)    All other components within normal limits  URINALYSIS, ROUTINE W REFLEX MICROSCOPIC - Abnormal; Notable for the following:    APPearance HAZY (*)    Leukocytes, UA MODERATE (*)    Bacteria, UA RARE (*)    Squamous Epithelial / LPF 6-30 (*)    All other components within normal limits  LIPASE, BLOOD  CBC  I-STAT BETA HCG BLOOD, ED ([facility], [facility], [facility] ONLY)   ____________________________________________  EKG  None ____________________________________________  RADIOLOGY  [place] Abdomen Complete  Result Date: 06/13/2016 CLINICAL DATA:  Right upper quadrant abdominal pain EXAM: ABDOMEN ULTRASOUND COMPLETE COMPARISON:  None. FINDINGS: Gallbladder: Gallstones and sludge noted within the gallbladder. Largest gallstone in the region of the gallbladder neck measures 2.7 cm. No wall thickening. Negative sonographic Murphy's. Common bile duct: Diameter: 3 mm in diameter Liver: No focal lesion identified. Within normal limits in parenchymal echogenicity. IVC: No abnormality visualized. Pancreas:  Visualized portion unremarkable. Spleen: Size and appearance within normal limits. Right Kidney: Length: 9.3 cm. Echogenicity within normal limits. No mass or hydronephrosis visualized. Left Kidney: Length: 9.3 cm. Echogenicity within normal limits. No mass or hydronephrosis visualized. Abdominal aorta: No aneurysm visualized. Other findings: None. IMPRESSION: Cholelithiasis with 2.7 cm gallstone noted in the region of the gallbladder neck. There is sludge within the gallbladder. No wall thickening or sonographic Murphy's sign. Electronically Signed   By: Charlett NoseKevin  Dover M.D.   On: 06/13/2016 12:38    ____________________________________________   PROCEDURES  Procedure(s) performed:   Procedures   ____________________________________________   INITIAL IMPRESSION / ASSESSMENT AND PLAN / ED COURSE  Pertinent labs & imaging results that were available during my care of the patient were reviewed by me and considered in my medical decision making (see chart for details).  Wendy Park is a 20 y.o. female with no significant past medical history came to the ED with complaint of crampy right upper quadrant abdominal pain, started at midnight while she was asleep, associated with nausea and vomiting.  Her pain improved with GI cocktail and Toradol. CBC, CMP and lipase are within normal limits.  Her abdominal US shows Gallstones and sludge noted within the gallbladder.Largest gallstone  in the region of the gallbladder neck measures 2.7 cm.  General surgery was consulted. Gen. Surgery Advised to discharge her home with ibuprofen and Zofran. She should take liquid diet for 2 days and then moved to low fat diet. We will call her with an appointment to be seen as an outpatient.  She is being discharged home. Her pain was resolved at the time of discharge. ____________________________________________  FINAL CLINICAL IMPRESSION(S) / ED DIAGNOSES  Biliary colic with gallstones.   MEDICATIONS  GIVEN DURING THIS VISIT:  Medications  ondansetron (ZOFRAN) injection 4 mg (4 mg Intravenous Given 06/13/16 0956)  gi cocktail (Maalox,Lidocaine,Donnatal) (30 mLs Oral Given 06/13/16 1022)  sodium chloride 0.9 % bolus 1,000 mL (0 mLs Intravenous Stopped 06/13/16 1319)  ketorolac (TORADOL) 30 MG/ML injection 30 mg (30 mg Intravenous Given 06/13/16 1020)  0.9 %  sodium chloride infusion (125 mL/hr Intravenous New Bag/Given 06/13/16 1333)     NEW OUTPATIENT MEDICATIONS STARTED DURING THIS VISIT:  New Prescriptions   No medications on file      Note:  This document was prepared using Dragon voice recognition software and may include unintentional dictation errors.     Arnetha Courser, MD 06/13/16 1407    Arnetha Courser, MD 06/13/16 [phone]

## 2016-06-13 NOTE — ED Notes (Signed)
Informed MD that patients husband would like to speak with her.  MD acknowledged and will go see the patient.

## 2016-06-13 NOTE — ED Notes (Signed)
Patient sleeping, breathing even and unlabored.  NAD at this time. 

## 2016-06-13 NOTE — ED Notes (Signed)
Patient made aware of urine sample. Patient states she cannot void. Encouraged patient to void when able.

## 2016-06-13 NOTE — ED Notes (Signed)
ED Provider at bedside. 

## 2016-06-13 NOTE — ED Triage Notes (Signed)
Pt c/o RUQ pain since around midnight and vomiting that started around 6 this morning. Patient states that her mother and grandmother both had to have gallbladders removed.

## 2016-06-13 NOTE — ED Notes (Signed)
Discharge instructions, follow up care, and rx x3 reviewed with patient. Patient verbalized understanding. 

## 2016-06-13 NOTE — ED Provider Notes (Signed)
I saw and evaluated the patient, reviewed the resident's note and I agree with the findings and plan.   EKG Interpretation None     20 year old female presents with right upper quadrant pain started about midnight. Pain is been colicky and concerning for gallbladder disease. Awaiting results of abdominal ultrasound.   Lorre NickAnthony Derrious Bologna, MD 06/13/16 [phone]

## 2016-06-13 NOTE — Discharge Instructions (Signed)
Thank you for visiting ED today. Please call the general surgery at above-mentioned number to make an appointment. If you have more pain try using ibuprofen first, if that does not relieve your discomfort is when you can try Vicodin. You're being provided with a prescription of nausea medicines, you can use it if needed for nausea and vomiting. We'll just stay clinic [address] to soft diet for first 2 days, then switch to low-fat diet and avoid eating any fried food or food with high fat content.

## 2017-06-27 IMAGING — US US ABDOMEN COMPLETE
1 series · 14 of 25 positions shown · non-contrast
Comparison: None.

CLINICAL DATA: Right upper quadrant abdominal pain

EXAM:
ABDOMEN ULTRASOUND COMPLETE

[Series 1: us abdomen complete · 0.25mm/px · 14 of 115 slices shown]
[im 1/115]
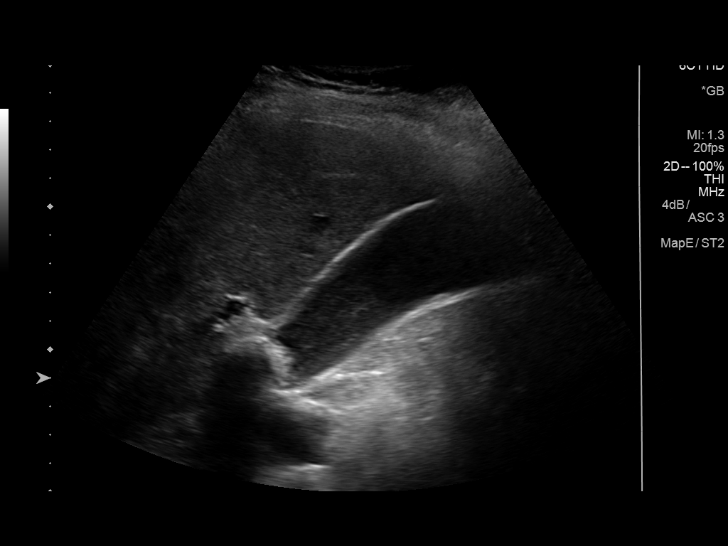
[im 10/115]
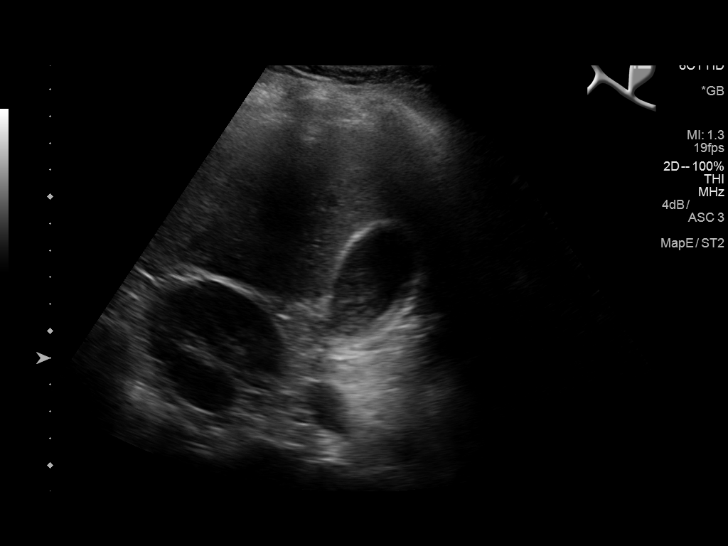
[im 20/115]
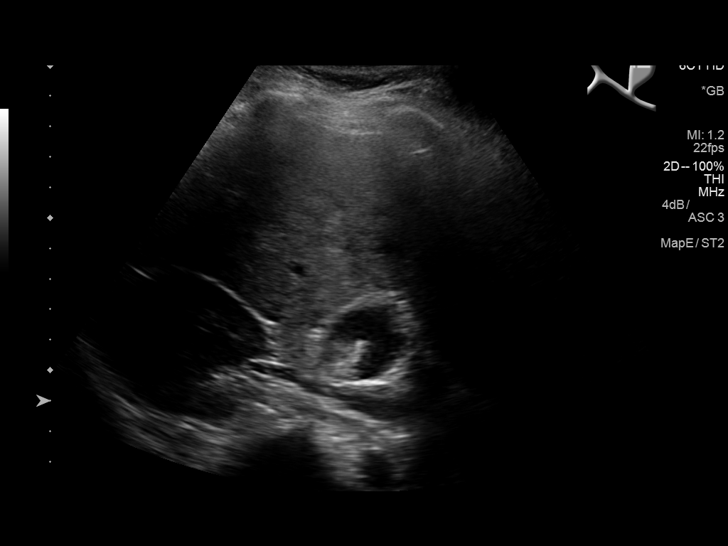
[im 29/115]
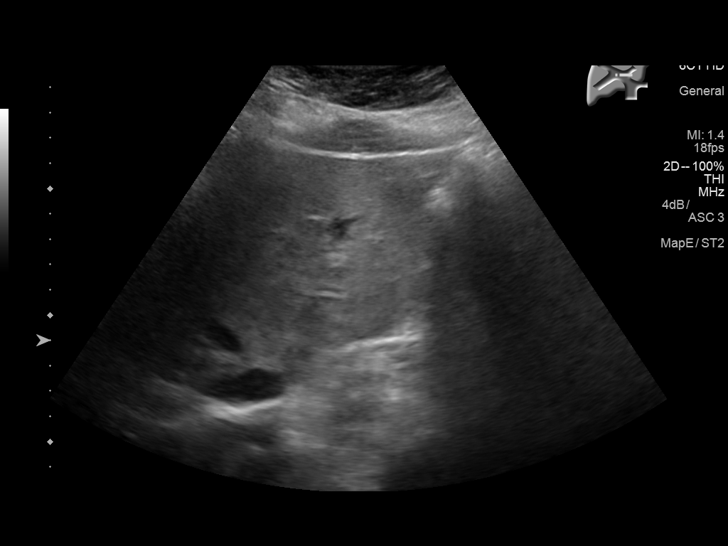
[im 39/115]
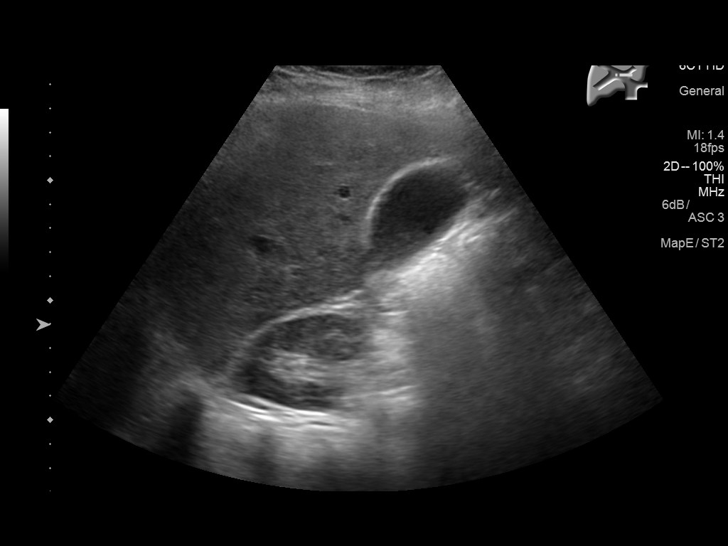
[im 43/115]
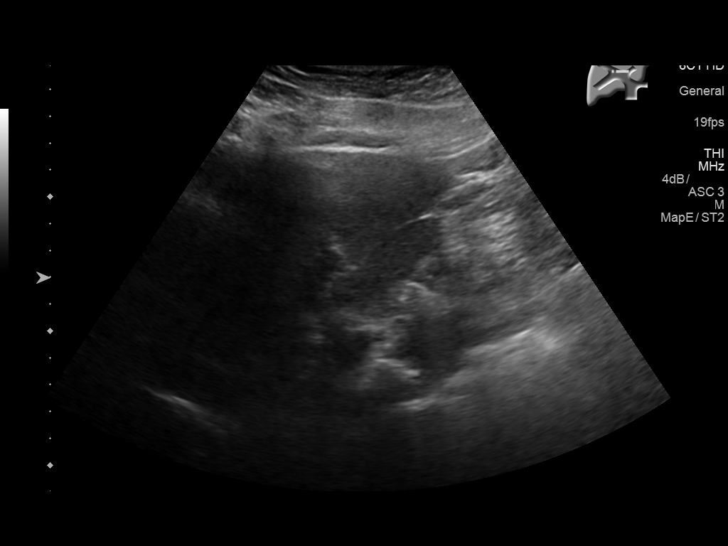
[im 53/115]
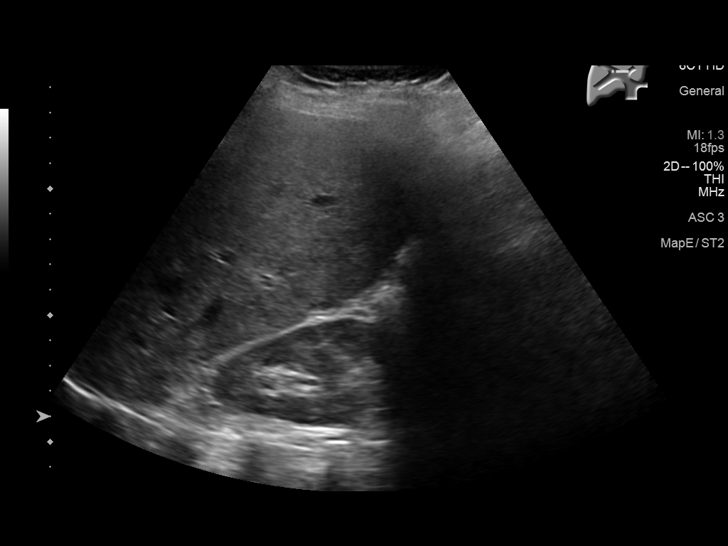
[im 62/115]
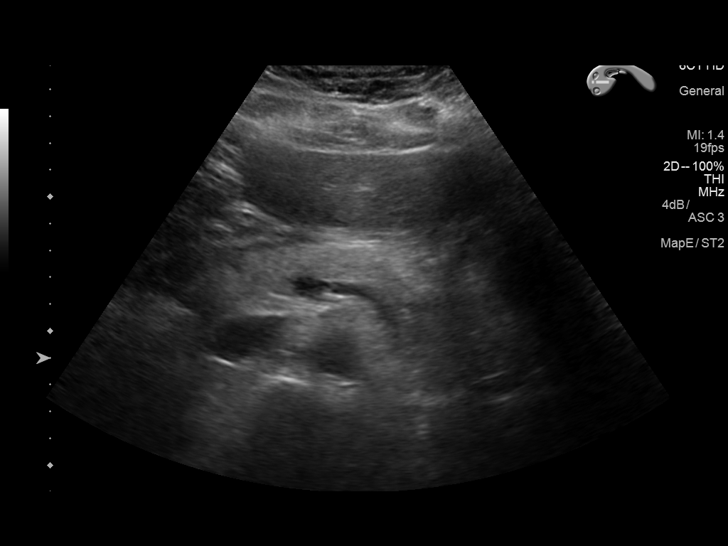
[im 72/115]
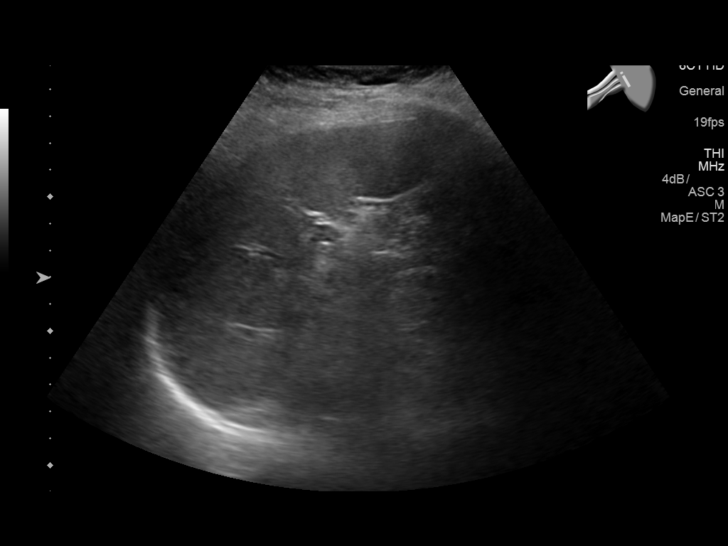
[im 77/115]
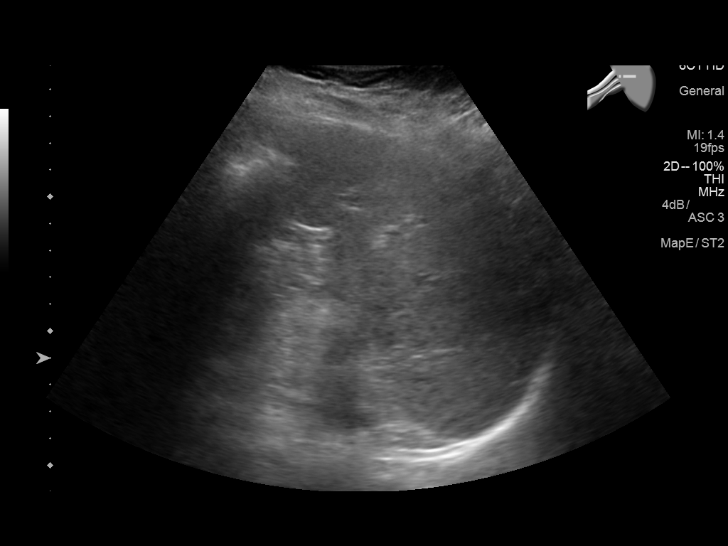
[im 86/115]
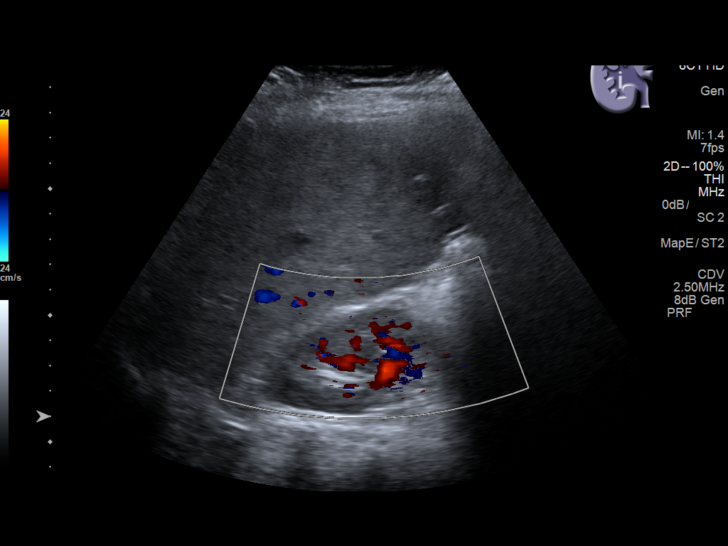
[im 96/115]
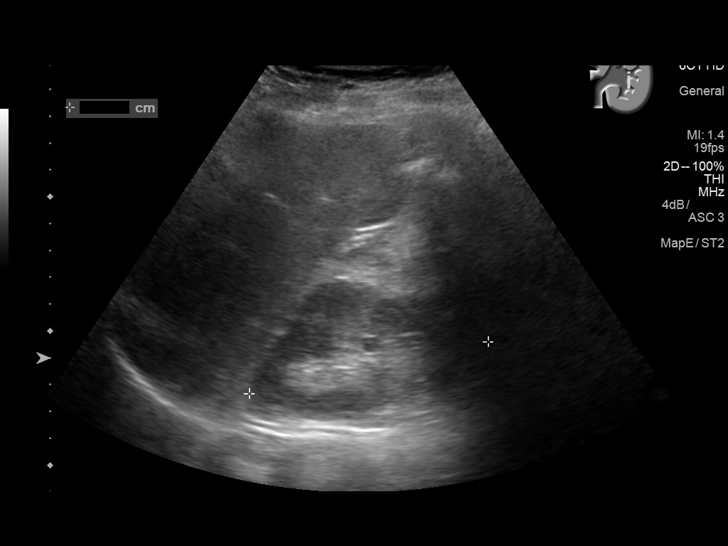
[im 105/115]
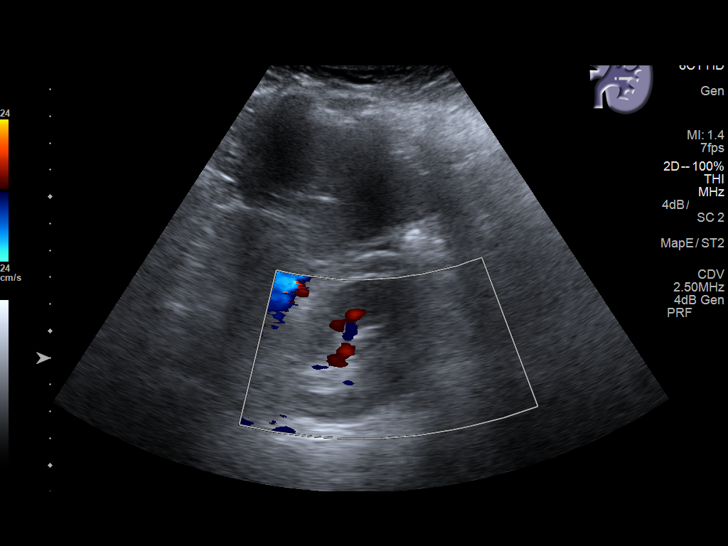
[im 115/115]
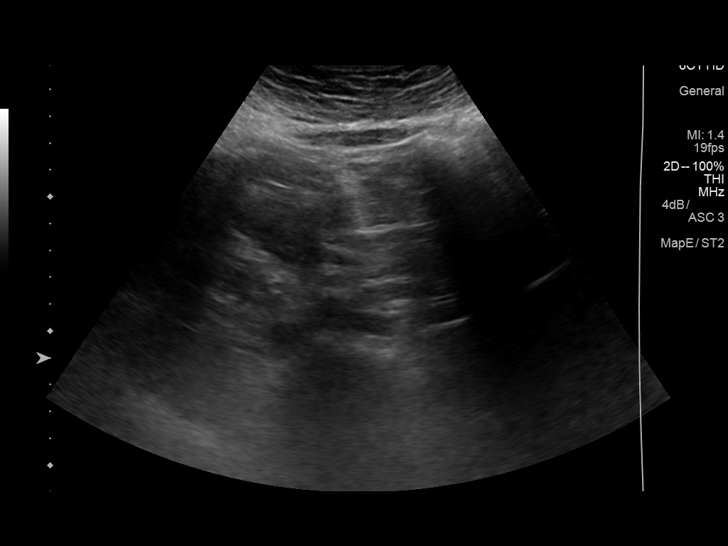

[14 of 25 positions shown; findings below may reference images not displayed]

FINDINGS: Gallbladder: Gallstones and sludge noted within the gallbladder.
Largest gallstone in the region of the gallbladder neck measures
cm. No wall thickening. Negative sonographic Young-G[place].

Common bile duct: Diameter: 3 mm in diameter

Liver: No focal lesion identified. Within normal limits in
parenchymal echogenicity.

IVC: No abnormality visualized.

Pancreas: Visualized portion unremarkable.

Spleen: Size and appearance within normal limits.

Right Kidney: Length: 9.3 cm. Echogenicity within normal limits. No
mass or hydronephrosis visualized.

Left Kidney: Length: 9.3 cm. Echogenicity within normal limits. No
mass or hydronephrosis visualized.

Abdominal aorta: No aneurysm visualized.

Other findings: None.
IMPRESSION: Cholelithiasis with 2.7 cm gallstone noted in the region of the
gallbladder neck. There is sludge within the gallbladder. No wall
thickening or sonographic Murphy's sign.

## 2020-07-11 ENCOUNTER — Ambulatory Visit
Admission: EM | Admit: 2020-07-11 | Discharge: 2020-07-11 | Discharge status: Against Medical Advice | Payer: Medicaid Other

## 2020-07-11 ENCOUNTER — Other Ambulatory Visit: Payer: Self-pay

## 2024-02-13 ENCOUNTER — Inpatient Hospital Stay (HOSPITAL_COMMUNITY)

## 2024-02-13 ENCOUNTER — Inpatient Hospital Stay (HOSPITAL_COMMUNITY)
Admission: AD | Admit: 2024-02-13 | Discharge: 2024-02-13 | Disposition: A | Discharge status: Home / Self Care | Attending: Obstetrics & Gynecology | Admitting: Obstetrics & Gynecology

## 2024-02-13 ENCOUNTER — Encounter (HOSPITAL_COMMUNITY): Payer: Self-pay | Admitting: *Deleted

## 2024-02-13 DIAGNOSIS — R1030 Lower abdominal pain, unspecified: Secondary | ICD-10-CM | POA: Diagnosis present

## 2024-02-13 DIAGNOSIS — O9A411 Sexual abuse complicating pregnancy, first trimester: Secondary | ICD-10-CM | POA: Insufficient documentation

## 2024-02-13 DIAGNOSIS — O209 Hemorrhage in early pregnancy, unspecified: Secondary | ICD-10-CM | POA: Diagnosis present

## 2024-02-13 DIAGNOSIS — Z3A00 Weeks of gestation of pregnancy not specified: Secondary | ICD-10-CM | POA: Diagnosis not present

## 2024-02-13 DIAGNOSIS — T7421XA Adult sexual abuse, confirmed, initial encounter: Secondary | ICD-10-CM

## 2024-02-13 DIAGNOSIS — N760 Acute vaginitis: Secondary | ICD-10-CM

## 2024-02-13 DIAGNOSIS — B9689 Other specified bacterial agents as the cause of diseases classified elsewhere: Secondary | ICD-10-CM

## 2024-02-13 DIAGNOSIS — Z113 Encounter for screening for infections with a predominantly sexual mode of transmission: Secondary | ICD-10-CM | POA: Diagnosis present

## 2024-02-13 DIAGNOSIS — O3680X0 Pregnancy with inconclusive fetal viability, not applicable or unspecified: Secondary | ICD-10-CM

## 2024-02-13 LAB — COMPREHENSIVE METABOLIC PANEL WITH GFR
ALT: 16 U/L (ref 0–44)
AST: 18 U/L (ref 15–41)
Albumin: 3.9 g/dL (ref 3.5–5.0)
Alkaline Phosphatase: 48 U/L (ref 38–126)
Anion gap: 9 (ref 5–15)
BUN: 11 mg/dL (ref 6–20)
CO2: 23 mmol/L (ref 22–32)
Calcium: 9.4 mg/dL (ref 8.9–10.3)
Chloride: 106 mmol/L (ref 98–111)
Creatinine, Ser: 0.75 mg/dL (ref 0.44–1.00)
GFR, Estimated: >60 mL/min (ref >60)
Glucose, Bld: 102 mg/dL — ABNORMAL HIGH (ref 70–99)
Potassium: 3.4 mmol/L — ABNORMAL LOW (ref 3.5–5.1)
Sodium: 138 mmol/L (ref 135–145)
Total Bilirubin: 0.5 mg/dL (ref 0.0–1.2)
Total Protein: 6.7 g/dL (ref 6.5–8.1)

## 2024-02-13 LAB — URINALYSIS, ROUTINE W REFLEX MICROSCOPIC
Bacteria, UA: NONE SEEN
Bilirubin Urine: NEGATIVE
Glucose, UA: NEGATIVE mg/dL
Hgb urine dipstick: NEGATIVE
Ketones, ur: 5 mg/dL — AB
Nitrite: NEGATIVE
Protein, ur: NEGATIVE mg/dL
Specific Gravity, Urine: 1.021 (ref 1.005–1.030)
pH: 5 (ref 5.0–8.0)

## 2024-02-13 LAB — CBC WITH DIFFERENTIAL/PLATELET
Abs Immature Granulocytes: 0.02 K/uL (ref 0.00–0.07)
Basophils Absolute: 0 K/uL (ref 0.0–0.1)
Basophils Relative: 0 %
Eosinophils Absolute: 0.1 K/uL (ref 0.0–0.5)
Eosinophils Relative: 1 %
HCT: 37.4 % (ref 36.0–46.0)
Hemoglobin: 13 g/dL (ref 12.0–15.0)
Immature Granulocytes: 0 %
Lymphocytes Relative: 28 %
Lymphs Abs: 2.4 K/uL (ref 0.7–4.0)
MCH: 30.5 pg (ref 26.0–34.0)
MCHC: 34.8 g/dL (ref 30.0–36.0)
MCV: 87.8 fL (ref 80.0–100.0)
Monocytes Absolute: 0.5 K/uL (ref 0.1–1.0)
Monocytes Relative: 6 %
Neutro Abs: 5.4 K/uL (ref 1.7–7.7)
Neutrophils Relative %: 65 %
Platelets: 265 K/uL (ref 150–400)
RBC: 4.26 MIL/uL (ref 3.87–5.11)
RDW: 12 % (ref 11.5–15.5)
WBC: 8.4 K/uL (ref 4.0–10.5)
nRBC: 0 % (ref 0.0–0.2)

## 2024-02-13 LAB — POCT PREGNANCY, URINE: Preg Test, Ur: POSITIVE — AB

## 2024-02-13 LAB — WET PREP, GENITAL
Sperm: NONE SEEN
Trich, Wet Prep: NONE SEEN
WBC, Wet Prep HPF POC: >=10 — AB (ref <10)
Yeast Wet Prep HPF POC: NONE SEEN

## 2024-02-13 LAB — GC/CHLAMYDIA PROBE AMP (~~LOC~~) NOT AT ARMC
Chlamydia: NEGATIVE
Comment: NEGATIVE
Comment: NORMAL
Neisseria Gonorrhea: NEGATIVE

## 2024-02-13 LAB — LIPASE, BLOOD: Lipase: 28 U/L (ref 11–51)

## 2024-02-13 LAB — ABO/RH: ABO/RH(D): O POS

## 2024-02-13 LAB — HCG, QUANTITATIVE, PREGNANCY: hCG, Beta Chain, Quant, S: 141 m[IU]/mL — ABNORMAL HIGH (ref <5)

## 2024-02-13 MED ORDER — METRONIDAZOLE 500 MG PO TABS: 500.0000 mg | ORAL_TABLET | Freq: Two times a day (BID) | ORAL | Status: DC

## 2024-02-13 MED ORDER — METRONIDAZOLE 500 MG PO TABS: 500.0000 mg | ORAL_TABLET | Freq: Two times a day (BID) | ORAL | 0 refills | Status: AC
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
## 2024-02-13 NOTE — Discharge Instructions (Addendum)
 [facility] Address: [address] [place]. [address] [place], [place] [address] Hours:  Monday 9AM-5PM Tuesday 10AM-6PM Wednesday 11AM-7PM Thursday 9AM-5PM Friday              8AM-2PM Saturday "Sunday  Phone: [phone]   [facility]  [email or web address]   [facility] [place] [facility]   [facility]             [address], [place], [place] [address] [phone]
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
## 2024-02-13 NOTE — Progress Notes (Signed)
 Written and verbal d/c instructions given and pt voiced understanding.

## 2024-02-15 ENCOUNTER — Encounter (HOSPITAL_COMMUNITY): Payer: Self-pay | Admitting: Obstetrics and Gynecology

## 2024-02-15 ENCOUNTER — Ambulatory Visit

## 2024-02-15 ENCOUNTER — Inpatient Hospital Stay (HOSPITAL_COMMUNITY)
Admission: AD | Admit: 2024-02-15 | Discharge: 2024-02-15 | Disposition: A | Discharge status: Home / Self Care | Payer: Self-pay | Attending: Obstetrics and Gynecology | Admitting: Obstetrics and Gynecology

## 2024-02-15 DIAGNOSIS — R109 Unspecified abdominal pain: Secondary | ICD-10-CM | POA: Diagnosis present

## 2024-02-15 DIAGNOSIS — O3680X0 Pregnancy with inconclusive fetal viability, not applicable or unspecified: Secondary | ICD-10-CM

## 2024-02-15 DIAGNOSIS — Z3A01 Less than 8 weeks gestation of pregnancy: Secondary | ICD-10-CM

## 2024-02-15 DIAGNOSIS — O209 Hemorrhage in early pregnancy, unspecified: Secondary | ICD-10-CM | POA: Diagnosis present

## 2024-02-15 HISTORY — DX: Headache, unspecified: R51.9

## 2024-02-15 HISTORY — DX: Urinary tract infection, site not specified: N39.0

## 2024-02-15 HISTORY — DX: Acute parametritis and pelvic cellulitis: N73.0

## 2024-02-15 LAB — HCG, QUANTITATIVE, PREGNANCY: hCG, Beta Chain, Quant, S: 427 m[IU]/mL — ABNORMAL HIGH (ref <5)

## 2024-02-15 NOTE — MAU Provider Note (Signed)
 History   Chief Complaint:  Follow-up   Wendy Park is a 28 y.o. G3P1011 at [redacted]w[redacted]d who presents for labs. Was seen in MAU on 8/13 for vaginal bleeding. Reports bleeding has since stopped. Reports some abdominal soreness.   Physical Exam   Blood pressure 118/75, pulse 89, temperature 99.4 F (37.4 C), temperature source Oral, resp. rate 18, last menstrual period 01/15/2024, SpO2 100%, unknown if currently breastfeeding.  Physical Examination: General appearance - alert, well appearing, and in no distress Mental status - alert, oriented to person, place, and time, normal mood, behavior, speech, dress, motor activity, and thought processes Eyes - pupils equal and reactive, extraocular eye movements intact, sclera anicteric Chest - normal respiratory effort  Labs: Results for orders placed or performed during the hospital encounter of 02/15/24 (from the past 24 hours)  hCG, quantitative, pregnancy   Collection Time: 02/15/24  1:26 PM  Result Value Ref Range   hCG, Beta Chain, Quant, S 427 (H) <5 mIU/mL    Ultrasound Studies:   No results found.  Assessment:   1. Pregnancy of unknown anatomic location   2. [redacted] weeks gestation of pregnancy     Component     Latest Ref Rng 02/13/2024 02/15/2024  HCG, Beta Chain, Quant, S     <5 mIU/mL 141 (H)  427 (H)     Appropriate rise in HCG. Already scheduled for f/u viability scan 8/28  Plan: -Discharge home in stable condition -SAB vs ectopic precautions discussed -Patient advised to follow-up with U/s 8/28 -Patient may return to MAU as needed or if her condition were to change or worsen  Rocky Satterfield, NP 02/15/2024, 4:56 PM

## 2024-02-15 NOTE — Discharge Instructions (Signed)
Return to care  If you have heavier bleeding that soaks through more than 2 pads per hour for an hour or more If you bleed so much that you feel like you might pass out or you do pass out If you have significant abdominal pain that is not improved with Tylenol      Safe Medications in Pregnancy   Acne: Benzoyl Peroxide Salicylic Acid  Backache/Headache: Tylenol: 2 regular strength every 4 hours OR              2 Extra strength every 6 hours  Colds/Coughs/Allergies: Benadryl (alcohol free) 25 mg every 6 hours as needed Breath right strips Claritin Cepacol throat lozenges Chloraseptic throat spray Cold-Eeze- up to three times per day Cough drops, alcohol free Flonase (by prescription only) Guaifenesin Mucinex Robitussin DM (plain only, alcohol free) Saline nasal spray/drops Sudafed (pseudoephedrine) & Actifed ** use only after [redacted] weeks gestation and if you do not have high blood pressure Tylenol Vicks Vaporub Zinc lozenges Zyrtec   Constipation: Colace Ducolax suppositories Fleet enema Glycerin suppositories Metamucil Milk of magnesia Miralax Senokot Smooth move tea  Diarrhea: Kaopectate Imodium A-D  *NO pepto Bismol  Hemorrhoids: Anusol Anusol HC Preparation H Tucks  Indigestion: Tums Maalox Mylanta Zantac  Pepcid  Insomnia: Benadryl (alcohol free) 25mg every 6 hours as needed Tylenol PM Unisom, no Gelcaps  Leg Cramps: Tums MagGel  Nausea/Vomiting:  Bonine Dramamine Emetrol Ginger extract Sea bands Meclizine  Nausea medication to take during pregnancy:  Unisom (doxylamine succinate 25 mg tablets) Take one tablet daily at bedtime. If symptoms are not adequately controlled, the dose can be increased to a maximum recommended dose of two tablets daily (1/2 tablet in the morning, 1/2 tablet mid-afternoon and one at bedtime). Vitamin B6 100mg tablets. Take one tablet twice a day (up to 200 mg per day).  Skin Rashes: Aveeno  products Benadryl cream or 25mg every 6 hours as needed Calamine Lotion 1% cortisone cream  Yeast infection: Gyne-lotrimin 7 Monistat 7  Gum/tooth pain: Anbesol  **If taking multiple medications, please check labels to avoid duplicating the same active ingredients **take medication as directed on the label ** Do not exceed 4000 mg of tylenol in 24 hours **Do not take medications that contain aspirin or ibuprofen    

## 2024-02-15 NOTE — MAU Note (Signed)
 Wendy Park is a 28 y.o. at Unknown here in MAU reporting: here for repeat blood work.  States bleeding has pretty much stopped.  Still having some cramping and generally feels sore and tender, feels like she is coming on her period LMP: middle of July, just remembered related to  vacation Onset of complaint: couple wks Pain score: 3 Vitals:   02/15/24 1405  BP: 118/75  Pulse: 89  Resp: 18  Temp: 99.4 F (37.4 C)  SpO2: 100%      Lab orders placed from triage:  blood work has been drawn  Reports has been congested and lymph nodes feel swollen..... first noted 3 days go .

## 2024-02-25 ENCOUNTER — Other Ambulatory Visit

## 2024-02-27 ENCOUNTER — Other Ambulatory Visit: Payer: Self-pay | Admitting: *Deleted

## 2024-02-27 DIAGNOSIS — Z3687 Encounter for antenatal screening for uncertain dates: Secondary | ICD-10-CM

## 2024-02-27 DIAGNOSIS — O3680X0 Pregnancy with inconclusive fetal viability, not applicable or unspecified: Secondary | ICD-10-CM

## 2024-02-28 ENCOUNTER — Other Ambulatory Visit

## 2024-02-28 DIAGNOSIS — Z3491 Encounter for supervision of normal pregnancy, unspecified, first trimester: Secondary | ICD-10-CM

## 2024-02-28 DIAGNOSIS — Z3687 Encounter for antenatal screening for uncertain dates: Secondary | ICD-10-CM

## 2024-02-28 DIAGNOSIS — O3680X0 Pregnancy with inconclusive fetal viability, not applicable or unspecified: Secondary | ICD-10-CM

## 2024-02-28 DIAGNOSIS — Z3A01 Less than 8 weeks gestation of pregnancy: Secondary | ICD-10-CM

## 2024-03-07 ENCOUNTER — Inpatient Hospital Stay (HOSPITAL_COMMUNITY)

## 2024-03-07 ENCOUNTER — Inpatient Hospital Stay (HOSPITAL_COMMUNITY)
Admission: AD | Admit: 2024-03-07 | Discharge: 2024-03-07 | Disposition: A | Discharge status: Home / Self Care | Attending: Obstetrics and Gynecology | Admitting: Obstetrics and Gynecology

## 2024-03-07 ENCOUNTER — Encounter (HOSPITAL_COMMUNITY): Payer: Self-pay | Admitting: Obstetrics and Gynecology

## 2024-03-07 DIAGNOSIS — O039 Complete or unspecified spontaneous abortion without complication: Secondary | ICD-10-CM | POA: Diagnosis present

## 2024-03-07 DIAGNOSIS — Z3A01 Less than 8 weeks gestation of pregnancy: Secondary | ICD-10-CM

## 2024-03-07 NOTE — MAU Note (Signed)
 Wendy Park is a 28 y.o. at [redacted]w[redacted]d here in MAU reporting: took a nap on Wed, when she woke up. She was covered in blood and was having cramping like an intense period.  During the night, she passed a clot a little larger than a golfball, was whitish, so tissue and blood.  Bleeding has continued, moderate to heavy, changes every few hours.  still cramping.   Had n/v Wed and Thu.  Onset of complaint: Wed Pain score: 7 Vitals:   03/07/24 1322  BP: 110/65  Pulse: 61  Resp: 16  Temp: 99 F (37.2 C)  SpO2: 100%      Lab orders placed from triage:

## 2024-03-07 NOTE — MAU Provider Note (Signed)
 Chief Complaint: Abdominal Pain and Vaginal Bleeding   Event Date/Time   First Provider Initiated Contact with Patient 03/07/24 1632      SUBJECTIVE HPI: Wendy Park is a 28 y.o. G3P1011 at [redacted]w[redacted]d by LMP who presents to maternity admissions reporting onset of heavy vaginal bleeding and cramping 3-4 days ago that has become lighter today.  She was passing large clots until yesterday, but today has been improved. She initially presented on 02/13/24 with bleeding after intercourse and mild cramping. See MAU note on that date. Hcg was 141 and no visible IUP or ectopic was seen on US . Follow up hcg rose to  427 and US  on 02/28/24 with viable IUP at [redacted]w[redacted]d.  This bleeding resolved until 3-4 days ago when the heavy bleeding started.    HPI  Past Medical History:  Diagnosis Date   Allergy    uses inhaler, hard time breathing with some seasonal allergies   Anemia    Anxiety    Depression    GERD (gastroesophageal reflux disease)    Headache    OCD (obsessive compulsive disorder)    PID (acute pelvic inflammatory disease)    Pregnancy    G1 P1   UTI (urinary tract infection)    Past Surgical History:  Procedure Laterality Date   CESAREAN SECTION N/A 02/03/2016   Procedure: CESAREAN SECTION;  Surgeon: Jerolyn Foil, MD;  Location: [facility];  Service: Obstetrics;  Laterality: N/A;   LAPAROSCOPIC CHOLECYSTECTOMY     miringotomy     Social History   Socioeconomic History   Marital status: Single    Spouse name: Not on file   Number of children: Not on file   Years of education: Not on file   Highest education level: Not on file  Occupational History   Not on file  Tobacco Use   Smoking status: Former    Current packs/day: 0.25    Average packs/day: 0.3 packs/day for 5.0 years (1.3 ttl pk-yrs)    Types: Cigarettes   Smokeless tobacco: Former   Tobacco comments:    Mostly vaping, not every day  Vaping Use   Vaping status: Some Days  Substance and Sexual Activity   Alcohol  use: No   Drug use: No   Sexual activity: Not Currently  Other Topics Concern   Not on file  Social History Narrative   Not on file   Social Drivers of Health   Financial Resource Strain: Not on file  Food Insecurity: Not on file  Transportation Needs: Not on file  Physical Activity: Not on file  Stress: Not on file  Social Connections: Not on file  Intimate Partner Violence: Not on file   No current facility-administered medications on file prior to encounter.   Current Outpatient Medications on File Prior to Encounter  Medication Sig Dispense Refill   lisdexamfetamine (VYVANSE) 60 MG capsule Take 60 mg by mouth every morning.     metroNIDAZOLE  (FLAGYL ) 500 MG tablet Take 1 tablet (500 mg total) by mouth every 12 (twelve) hours. 14 tablet 0   ondansetron  (ZOFRAN ) 4 MG tablet Take 1 tablet (4 mg total) by mouth every 6 (six) hours. (Patient not taking: Reported on 02/13/2024) 15 tablet 0   OVER THE COUNTER MEDICATION Take 3 tablets by mouth every 6 (six) hours as needed (gas pain). Gas relief medication     propranolol (INDERAL) 10 MG tablet Take 10 mg by mouth 3 (three) times daily as needed. INTRUSIVE ANXIETY (Patient not taking: Reported  on 02/13/2024)  2   sertraline  (ZOLOFT ) 100 MG tablet Take 100 mg by mouth at bedtime.  (Patient not taking: Reported on 02/13/2024)     Allergies  Allergen Reactions   Bee Venom Anaphylaxis   Fentanyl      itching   Tramadol Itching    ROS:  Review of Systems  Constitutional:  Negative for chills, fatigue and fever.  Respiratory:  Negative for shortness of breath.   Cardiovascular:  Negative for chest pain.  Gastrointestinal:  Positive for abdominal pain.  Genitourinary:  Positive for vaginal bleeding. Negative for difficulty urinating, dysuria, flank pain, pelvic pain, vaginal discharge and vaginal pain.  Neurological:  Negative for dizziness and headaches.  Psychiatric/Behavioral: Negative.       I have reviewed patient's Past  Medical Hx, Surgical Hx, Family Hx, Social Hx, medications and allergies.   Physical Exam  Patient Vitals for the past 24 hrs:  BP Temp Temp src Pulse Resp SpO2 Height Weight  03/07/24 1322 110/65 99 F (37.2 C) Oral 61 16 100 % 5' 1 (1.549 m) 67.8 kg   Constitutional: Well-developed, well-nourished female in no acute distress.  Cardiovascular: normal rate Respiratory: normal effort GI: Abd soft, non-tender. Pos BS x 4 MS: Extremities nontender, no edema, normal ROM Neurologic: Alert and oriented x 4.  GU: Neg CVAT.  PELVIC EXAM: Deferred   LAB RESULTS No results found for this or any previous visit (from the past 24 hours).  --/--/O POS (08/13 0250)  IMAGING US  OB Transvaginal Result Date: 03/07/2024 CLINICAL DATA:  Pregnancy, vaginal bleeding in the first trimester EXAM: TRANSVAGINAL OB ULTRASOUND TECHNIQUE: Transvaginal ultrasound was performed for complete evaluation of the gestation as well as the maternal uterus, adnexal regions, and pelvic cul-de-sac. COMPARISON:  02/28/2024 FINDINGS: Intrauterine gestational sac: Absent Yolk sac:  Absent Embryo:  Absent Cardiac Activity: N/A Subchorionic hemorrhage:  N/A Maternal uterus/adnexae: Endometrial thickness 1.4 cm on image 10 of series 1 -1. Corpus luteum in the left ovary. IMPRESSION: 1. The previously present intrauterine gestational sac containing embryo and yolk sac shown on 02/28/2024 is no longer present, compatible with early pregnancy loss/miscarriage. Electronically Signed   By: Ryan Salvage M.D.   On: 03/07/2024 14:35   US  OB Transvaginal Result Date: 03/04/2024 ----------------------------------------------------------------------  OBSTETRICS REPORT                       (Signed Final 03/04/2024 12:41 pm) ---------------------------------------------------------------------- Patient Info  ID #:       [id number]                          D.O.B.:  16-Jan-1996 (28 yrs)(F)  Name:       Wendy Park                 Visit Date:  02/28/2024 04:35 pm ---------------------------------------------------------------------- Performed By  Attending:        Jerilynn Buddle MD       Ref. Address:     [address]                                                             [place], [place]  [address]  Performed By:     Scarlet Flesher         [facility]  Referred By:      [facility] ---------------------------------------------------------------------- Orders  #  Description                           Code        Ordered By  1  US  OB TRANSVAGINAL                    23182.9     ROCKY SATTERFIELD ----------------------------------------------------------------------  #  Order #                     Accession #                Episode #  1  [id number]                   [id number]                 [id number] ---------------------------------------------------------------------- Indications  Weeks of gestation of pregnancy not            Z3A.00  specified  Encounter for uncertain dates                  Z36.87  Pregnancy with inconclusive fetal viability    O36.80X0 ---------------------------------------------------------------------- Fetal Evaluation  Num Of Fetuses:         1  Preg. Location:         Intrauterine  Gest. Sac:              Intrauterine  Yolk Sac:               Visualized  Fetal Pole:             Visualized  Fetal Heart Rate(bpm):  126  Cardiac Activity:       Observed  Comment:    Probable corpus luteum LT ovary, FHT 126, EGA [redacted]w[redacted]d, EDC              10/20/24 ---------------------------------------------------------------------- Biometry  CRL:       5.9  mm     G. Age:  6w  3d                   EDD:   10/20/24 ---------------------------------------------------------------------- OB History  Gravidity:    3         Term:   1        Prem:   0        SAB:   1  TOP:          0       Ectopic:  0        Living: 1 ----------------------------------------------------------------------                Jerilynn Buddle, MD Electronically Signed Final Report   03/04/2024 12:41 pm ----------------------------------------------------------------------   US  OB LESS THAN 14 WEEKS WITH OB TRANSVAGINAL Result Date: 02/13/2024 CLINICAL DATA:  Vaginal bleeding with pelvic pain for several weeks, known positive pregnancy test EXAM: OBSTETRIC <14 WK US  AND TRANSVAGINAL OB US  TECHNIQUE: Both transabdominal and transvaginal ultrasound examinations were performed for complete evaluation of the gestation as well as the maternal uterus, adnexal regions, and pelvic cul-de-sac. Transvaginal technique was performed to assess early pregnancy. COMPARISON:  None Available. FINDINGS: Intrauterine gestational sac: Absent Maternal uterus/adnexae: Uterus is within normal limits. Ovaries are well visualized and within normal limits. IMPRESSION: No evidence of intra or extra uterine gestation. No other focal abnormality is noted. Electronically Signed   By: Oneil Devonshire M.D.   On: 02/13/2024 03:33    MAU Management/MDM: Orders Placed This Encounter  Procedures   US  OB Transvaginal   Discharge patient Discharge disposition: 01-Home or Self Care; Discharge patient date: 03/07/2024    No orders of the defined types were placed in this encounter.   US  today confirms SAB. Reviewed with pt in MAU today. Questions answered. Comfort care/resources given. Pt to f/u in office in 2 weeks. Return precautions given.    ASSESSMENT 1. SAB (spontaneous abortion)   2. [redacted] weeks gestation of pregnancy     PLAN Discharge home Allergies as of 03/07/2024       Reactions   Bee Venom Anaphylaxis   Fentanyl     itching   Tramadol  Itching        Medication List     TAKE these medications    lisdexamfetamine 60 MG capsule Commonly known as: VYVANSE Take 60 mg by mouth every morning.   metroNIDAZOLE  500 MG tablet Commonly known as: FLAGYL  Take 1 tablet (500 mg total) by mouth every 12 (twelve) hours.   ondansetron  4 MG tablet Commonly known as: ZOFRAN  Take 1 tablet (4 mg total) by mouth every 6 (six) hours.   OVER THE COUNTER MEDICATION Take 3 tablets by mouth every 6 (six) hours as needed (gas pain). Gas relief medication   propranolol 10 MG tablet Commonly known as: INDERAL Take 10 mg by mouth 3 (three) times daily as needed. INTRUSIVE ANXIETY   sertraline  100 MG tablet Commonly known as: ZOLOFT  Take 100 mg by mouth at bedtime.         Olam Boards Certified Nurse-Midwife 03/07/2024  4:33 PM

## 2024-03-25 ENCOUNTER — Encounter: Admitting: Family Medicine

## 2024-03-26 ENCOUNTER — Ambulatory Visit: Admitting: Family Medicine

## 2024-06-30 ENCOUNTER — Encounter: Payer: Self-pay | Admitting: Neurology

## 2024-10-08 ENCOUNTER — Ambulatory Visit: Payer: Self-pay | Admitting: Neurology
